# Patient Record
Sex: Female | Born: 1990 | Race: Black or African American | Hispanic: No | Marital: Single | Smoking: Never smoker
Health system: Southern US, Community
[De-identification: ages and names within clinical notes are randomized; demographics above are authoritative.]

## PROBLEM LIST (undated history)

## (undated) DIAGNOSIS — R42 Dizziness and giddiness: Secondary | ICD-10-CM

## (undated) NOTE — *Deleted (*Deleted)
Pt presents with sore throat, swollen tonsils, headache, body aches and sweats xs 3 days.

---

## 2018-12-21 ENCOUNTER — Other Ambulatory Visit: Payer: Self-pay

## 2018-12-21 DIAGNOSIS — Z20822 Contact with and (suspected) exposure to covid-19: Secondary | ICD-10-CM

## 2018-12-23 LAB — NOVEL CORONAVIRUS, NAA: SARS-CoV-2, NAA: NOT DETECTED

## 2019-03-06 ENCOUNTER — Encounter (HOSPITAL_COMMUNITY): Payer: Self-pay

## 2019-03-06 ENCOUNTER — Other Ambulatory Visit: Payer: Self-pay

## 2019-03-06 ENCOUNTER — Ambulatory Visit (HOSPITAL_COMMUNITY)
Admission: EM | Admit: 2019-03-06 | Discharge: 2019-03-06 | Disposition: A | Discharge status: Home / Self Care | Payer: Self-pay | Attending: Family Medicine | Admitting: Family Medicine

## 2019-03-06 DIAGNOSIS — L089 Local infection of the skin and subcutaneous tissue, unspecified: Secondary | ICD-10-CM

## 2019-03-06 DIAGNOSIS — R030 Elevated blood-pressure reading, without diagnosis of hypertension: Secondary | ICD-10-CM

## 2019-03-06 DIAGNOSIS — S3991XA Unspecified injury of abdomen, initial encounter: Secondary | ICD-10-CM

## 2019-03-06 MED ORDER — SULFAMETHOXAZOLE-TRIMETHOPRIM 800-160 MG PO TABS: 1.0000 | ORAL_TABLET | Freq: Two times a day (BID) | ORAL | 0 refills | Status: AC

## 2019-03-06 MED ORDER — DICLOFENAC SODIUM 75 MG PO TBEC: 75.0000 mg | DELAYED_RELEASE_TABLET | Freq: Two times a day (BID) | ORAL | 0 refills | Status: DC
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
## 2019-03-06 NOTE — Discharge Instructions (Addendum)
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
## 2019-04-08 ENCOUNTER — Ambulatory Visit (HOSPITAL_COMMUNITY)
Admission: EM | Admit: 2019-04-08 | Discharge: 2019-04-08 | Disposition: A | Discharge status: Home / Self Care | Payer: Self-pay | Attending: Family Medicine | Admitting: Family Medicine

## 2019-04-08 ENCOUNTER — Other Ambulatory Visit: Payer: Self-pay

## 2019-04-08 ENCOUNTER — Encounter (HOSPITAL_COMMUNITY): Payer: Self-pay

## 2019-04-08 DIAGNOSIS — Z20828 Contact with and (suspected) exposure to other viral communicable diseases: Secondary | ICD-10-CM | POA: Insufficient documentation

## 2019-04-08 DIAGNOSIS — R519 Headache, unspecified: Secondary | ICD-10-CM | POA: Insufficient documentation

## 2019-04-08 DIAGNOSIS — R110 Nausea: Secondary | ICD-10-CM | POA: Insufficient documentation

## 2019-04-08 DIAGNOSIS — Z791 Long term (current) use of non-steroidal anti-inflammatories (NSAID): Secondary | ICD-10-CM | POA: Insufficient documentation

## 2019-04-08 DIAGNOSIS — J029 Acute pharyngitis, unspecified: Secondary | ICD-10-CM | POA: Insufficient documentation

## 2019-04-08 DIAGNOSIS — Z803 Family history of malignant neoplasm of breast: Secondary | ICD-10-CM | POA: Insufficient documentation

## 2019-04-08 DIAGNOSIS — N644 Mastodynia: Secondary | ICD-10-CM | POA: Insufficient documentation

## 2019-04-08 LAB — POCT RAPID STREP A: Streptococcus, Group A Screen (Direct): NEGATIVE

## 2019-04-08 NOTE — ED Triage Notes (Signed)
Pt presents with non productive cough, nausea, sore throat, and headache since yesterday.

## 2019-04-08 NOTE — Discharge Instructions (Addendum)
We will send you for ultrasound and mammogram due to your breast pain. Your strep test is negative Covid swab is pending and we will call you with any positive results. You can take over-the-counter medications as needed for symptoms Warm salt water gargles Make sure you are taking precautions and quarantining until we get your Covid results. Follow up as needed for continued or worsening symptoms

## 2019-04-09 ENCOUNTER — Other Ambulatory Visit: Payer: Self-pay | Admitting: Family Medicine

## 2019-04-09 DIAGNOSIS — N644 Mastodynia: Secondary | ICD-10-CM

## 2019-04-09 NOTE — ED Provider Notes (Signed)
[place]    CSN: [id number] Arrival date & time: 04/08/19  1434      History   Chief Complaint Chief Complaint  Patient presents with  . Sore Throat  . Cough  . Headache  . Nausea    HPI Audrey Klein is a 28 y.o. female.   Patient is a 28 year old female who presents today with approximately 1 day of dry cough, nausea, sore throat, headache.  Symptoms have been constant, waxing waning.  She has not taken anything for her symptoms.  Denies any associated fever, chills, body aches or night sweats.  She is also having left breast discomfort.  This is been an ongoing problem for her for approximately 6 months or more.  Reporting the pain has worsened.  She has had previous mammogram which she is not sure of the results.  Reporting she has had the breast aspirated before with worsening symptoms.  The pain initially started underneath the left breast and now his moved into the top of the breast.  She does have a family history of breast cancer.   ROS per HPI    Sore Throat Associated symptoms include headaches.  Cough Associated symptoms: headaches   Headache Associated symptoms: cough     History reviewed. No pertinent past medical history.  There are no active problems to display for this patient.   History reviewed. No pertinent surgical history.  OB History   No obstetric history on file.      Home Medications    Prior to Admission medications   Medication Sig Start Date End Date Taking? Authorizing Provider  diclofenac (VOLTAREN) 75 MG EC tablet Take 1 tablet (75 mg total) by mouth 2 (two) times daily. 03/06/19   [name], MD    Family History Family History  Problem Relation Age of Onset  . Cancer Mother     Social History Social History   Tobacco Use  . Smoking status: Never Smoker  . Smokeless tobacco: Never Used  Substance Use Topics  . Alcohol use: Not Currently  . Drug use: Not on file     Allergies   Patient has no  known allergies.   Review of Systems Review of Systems  Respiratory: Positive for cough.   Neurological: Positive for headaches.     Physical Exam Triage Vital Signs ED Triage Vitals  Enc Vitals Group     BP 04/08/19 1551 127/88     Pulse Rate 04/08/19 1551 74     Resp 04/08/19 1551 17     Temp 04/08/19 1551 98.9 F (37.2 C)     Temp Source 04/08/19 1551 Oral     SpO2 04/08/19 1551 100 %     Weight --      Height --      Head Circumference --      Peak Flow --      Pain Score 04/08/19 1553 6     Pain Loc --      Pain Edu? --      Excl. in [place]? --    No data found.  Updated Vital Signs BP 127/88 (BP Location: Right Arm)   Pulse 74   Temp 98.9 F (37.2 C) (Oral)   Resp 17   LMP 03/26/2019   SpO2 100%   Visual Acuity Right Eye Distance:   Left Eye Distance:   Bilateral Distance:    Right Eye Near:   Left Eye Near:    Bilateral Near:  Physical Exam Vitals signs and nursing note reviewed.  Constitutional:      General: She is not in acute distress.    Appearance: Normal appearance. She is not ill-appearing, toxic-appearing or diaphoretic.  HENT:     Head: Normocephalic and atraumatic.     Right Ear: Tympanic membrane and ear canal normal.     Left Ear: Tympanic membrane and ear canal normal.     Nose: Nose normal.     Mouth/Throat:     Pharynx: Oropharynx is clear.  Eyes:     Conjunctiva/sclera: Conjunctivae normal.  Neck:     Musculoskeletal: Normal range of motion.  Pulmonary:     Effort: Pulmonary effort is normal.  Chest:     Chest wall: Tenderness present.     Breasts:        Left: Tenderness present. No swelling, bleeding, inverted nipple, mass, nipple discharge or skin change.    Musculoskeletal: Normal range of motion.  Skin:    General: Skin is warm and dry.     Findings: No bruising, erythema or rash.  Neurological:     Mental Status: She is alert.  Psychiatric:        Mood and Affect: Mood normal.      UC Treatments /  Results  Labs (all labs ordered are listed, but only abnormal results are displayed) Labs Reviewed  CULTURE, GROUP A STREP ([facility])  NOVEL CORONAVIRUS, NAA (HOSPITAL ORDER, SEND-OUT TO REF LAB)  POCT RAPID STREP A    EKG   Radiology No results found.  Procedures Procedures (including critical care time)  Medications Ordered in UC Medications - No data to display  Initial Impression / Assessment and Plan / UC Course  I have reviewed the triage vital signs and the nursing notes.  Pertinent labs & imaging results that were available during my care of the patient were reviewed by me and considered in my medical decision making (see chart for details).     Sore throat-rapid strep test negative.  Will send for culture. Covid swab pending with precautions given Recommended warm salt water gargles and over-the-counter medications as needed for symptoms. We will send her for a mammogram based on worsening left breast pain with family history of breast cancer She can take ibuprofen for pain Follow up as needed for continued or worsening symptoms  Final Clinical Impressions(s) / UC Diagnoses   Final diagnoses:  Breast pain  Sore throat     Discharge Instructions     We will send you for ultrasound and mammogram due to your breast pain. Your strep test is negative Covid swab is pending and we will call you with any positive results. You can take over-the-counter medications as needed for symptoms Warm salt water gargles Make sure you are taking precautions and quarantining until we get your Covid results. Follow up as needed for continued or worsening symptoms     ED Prescriptions    None     PDMP not reviewed this encounter.   Janace Aris, NP 04/09/19 1144

## 2019-04-10 LAB — CULTURE, GROUP A STREP (THRC)

## 2019-04-10 LAB — NOVEL CORONAVIRUS, NAA (HOSP ORDER, SEND-OUT TO REF LAB; TAT 18-24 HRS): SARS-CoV-2, NAA: NOT DETECTED

## 2019-04-26 ENCOUNTER — Other Ambulatory Visit: Payer: Self-pay

## 2019-04-26 ENCOUNTER — Ambulatory Visit (HOSPITAL_COMMUNITY)
Admission: EM | Admit: 2019-04-26 | Discharge: 2019-04-26 | Disposition: A | Discharge status: Home / Self Care | Payer: Self-pay | Attending: Family Medicine | Admitting: Family Medicine

## 2019-04-26 ENCOUNTER — Encounter (HOSPITAL_COMMUNITY): Payer: Self-pay

## 2019-04-26 DIAGNOSIS — U071 COVID-19: Secondary | ICD-10-CM

## 2019-04-26 LAB — POC SARS CORONAVIRUS 2 AG: SARS Coronavirus 2 Ag: POSITIVE — AB

## 2019-04-26 LAB — POC SARS CORONAVIRUS 2 AG -  ED: SARS Coronavirus 2 Ag: POSITIVE — AB

## 2019-04-26 MED ORDER — ONDANSETRON HCL 4 MG PO TABS: 4.0000 mg | ORAL_TABLET | Freq: Four times a day (QID) | ORAL | 0 refills | Status: DC

## 2019-04-26 NOTE — Discharge Instructions (Signed)
Rest Push fluids Take the zofran as directed- pick up at drive through Tylenol for pain or fever CALL for problems or questions Go to ER for weakness or shortness of breath that worsens

## 2019-04-26 NOTE — ED Triage Notes (Signed)
Pt presents to UC w/ c/o loss of taste, loss of smell, coughing, dizziness, weakness, nausea x4 days. Pt states she has not eaten solid food in 4 days but has been able to keep water down.

## 2019-04-26 NOTE — ED Provider Notes (Signed)
[facility]    CSN: [id number] Arrival date & time: 04/26/19  1114      History   Chief Complaint Chief Complaint  Patient presents with  . loss of taste, smell, fatigue    HPI Audrey Klein is a 28 y.o. female.   HPI  Patient has been sick for 4 days.  She feels weak, dizzy, she has been coughing.  No appetite.  She feels nausea and has not been eating.  She has kept down some water and fluids.  She does not feel like she has normal taste or smell.  Has not measured her temperature at home, she has had some chills. No known exposure to coronavirus She works at [employer]  History reviewed. No pertinent past medical history.  There are no active problems to display for this patient.   History reviewed. No pertinent surgical history.  OB History   No obstetric history on file.      Home Medications    Prior to Admission medications   Medication Sig Start Date End Date Taking? Authorizing Provider  ondansetron (ZOFRAN) 4 MG tablet Take 1-2 tablets (4-8 mg total) by mouth every 6 (six) hours. 04/26/19   Eustace Moore, MD    Family History Family History  Problem Relation Age of Onset  . Cancer Mother   . Healthy Father     Social History Social History   Tobacco Use  . Smoking status: Never Smoker  . Smokeless tobacco: Never Used  Substance Use Topics  . Alcohol use: Not Currently  . Drug use: Not on file     Allergies   Patient has no known allergies.   Review of Systems Review of Systems  Constitutional: Positive for appetite change, chills and fatigue. Negative for fever.  HENT: Negative for ear pain and sore throat.   Eyes: Negative for pain and visual disturbance.  Respiratory: Positive for cough. Negative for shortness of breath.   Cardiovascular: Negative for chest pain and palpitations.  Gastrointestinal: Positive for nausea. Negative for abdominal pain and vomiting.  Genitourinary: Negative for dysuria and hematuria.   Musculoskeletal: Negative for arthralgias and back pain.  Skin: Negative for color change and rash.  Neurological: Negative for seizures and syncope.  All other systems reviewed and are negative.    Physical Exam Triage Vital Signs ED Triage Vitals  Enc Vitals Group     BP 04/26/19 1152 110/62     Pulse Rate 04/26/19 1152 74     Resp 04/26/19 1152 16     Temp 04/26/19 1152 98.7 F (37.1 C)     Temp Source 04/26/19 1152 Oral     SpO2 04/26/19 1152 100 %     Weight --      Height --      Head Circumference --      Peak Flow --      Pain Score 04/26/19 1157 0     Pain Loc --      Pain Edu? --      Excl. in [place]? --    No data found.  Updated Vital Signs BP 110/62 (BP Location: Right Arm)   Pulse 74   Temp 98.7 F (37.1 C) (Oral)   Resp 16   LMP 04/18/2019   SpO2 100%      Physical Exam Constitutional:      General: She is not in acute distress.    Appearance: She is well-developed. She is obese. She is ill-appearing.  Comments: Appears tired.  Tearful  HENT:     Head: Normocephalic and atraumatic.     Mouth/Throat:     Comments: Mask in place Eyes:     Conjunctiva/sclera: Conjunctivae normal.     Pupils: Pupils are equal, round, and reactive to light.  Neck:     Musculoskeletal: Normal range of motion.  Cardiovascular:     Rate and Rhythm: Normal rate and regular rhythm.     Heart sounds: Normal heart sounds.  Pulmonary:     Effort: Pulmonary effort is normal. No respiratory distress.     Comments: Lungs are clear Abdominal:     General: There is no distension.     Palpations: Abdomen is soft.  Musculoskeletal: Normal range of motion.  Skin:    General: Skin is warm and dry.  Neurological:     Mental Status: She is alert.  Psychiatric:        Mood and Affect: Mood normal.        Behavior: Behavior normal.      UC Treatments / Results  Labs (all labs ordered are listed, but only abnormal results are displayed) Labs Reviewed  POC SARS  CORONAVIRUS 2 AG -  ED - Abnormal; Notable for the following components:      Result Value   SARS Coronavirus 2 Ag POSITIVE (*)    All other components within normal limits  POC SARS CORONAVIRUS 2 AG - Abnormal; Notable for the following components:   SARS Coronavirus 2 Ag POSITIVE (*)    All other components within normal limits    EKG   Radiology No results found.  Procedures Procedures (including critical care time)  Medications Ordered in UC Medications - No data to display  Initial Impression / Assessment and Plan / UC Course  I have reviewed the triage vital signs and the nursing notes.  Pertinent labs & imaging results that were available during my care of the patient were reviewed by me and considered in my medical decision making (see chart for details).     We reviewed coronavirus symptoms.  Treatment.  Expectations. Return to work Final Clinical Impressions(s) / UC Diagnoses   Final diagnoses:  COVID-19 virus infection     Discharge Instructions     Rest Push fluids Take the zofran as directed- pick up at drive through Tylenol for pain or fever CALL for problems or questions Go to ER for weakness or shortness of breath that worsens   ED Prescriptions    Medication Sig Dispense Auth. Provider   ondansetron (ZOFRAN) 4 MG tablet Take 1-2 tablets (4-8 mg total) by mouth every 6 (six) hours. 12 tablet Raylene Everts, MD     PDMP not reviewed this encounter.   Raylene Everts, MD 04/26/19 1320

## 2019-08-07 ENCOUNTER — Encounter (HOSPITAL_COMMUNITY): Payer: Self-pay | Admitting: Emergency Medicine

## 2019-08-07 ENCOUNTER — Ambulatory Visit (HOSPITAL_COMMUNITY)
Admission: EM | Admit: 2019-08-07 | Discharge: 2019-08-07 | Disposition: A | Discharge status: Home / Self Care | Payer: Self-pay | Attending: Emergency Medicine | Admitting: Emergency Medicine

## 2019-08-07 ENCOUNTER — Other Ambulatory Visit: Payer: Self-pay

## 2019-08-07 DIAGNOSIS — S0501XA Injury of conjunctiva and corneal abrasion without foreign body, right eye, initial encounter: Secondary | ICD-10-CM

## 2019-08-07 DIAGNOSIS — H18821 Corneal disorder due to contact lens, right eye: Secondary | ICD-10-CM

## 2019-08-07 MED ORDER — ERYTHROMYCIN 5 MG/GM OP OINT: TOPICAL_OINTMENT | OPHTHALMIC | 1 refills | Status: DC

## 2019-08-07 NOTE — Discharge Instructions (Addendum)
Do not wear contacts for 1 week  If you have increase of pressure or blurred vision you will need to go to the ER  May use saline drops to help

## 2019-08-07 NOTE — ED Triage Notes (Signed)
Seen by provider prior to this nurse 

## 2019-08-07 NOTE — ED Provider Notes (Signed)
[facility]    CSN: [id number] Arrival date & time: 08/07/19  1955      History   Chief Complaint Chief Complaint  Patient presents with  . Eye Problem    HPI Audrey Klein is a 29 y.o. female.   Pt states that she was wearing contact yesterday and rubbed rt eye abd began to nice itching, pain, and burning to eye. Began using saline eye drops to help sooth. States she can see out of her eye it just burnings to open all the way.      History reviewed. No pertinent past medical history.  There are no problems to display for this patient.   History reviewed. No pertinent surgical history.  OB History   No obstetric history on file.      Home Medications    Prior to Admission medications   Medication Sig Start Date End Date Taking? Authorizing Provider  erythromycin ophthalmic ointment Place a 1/2 inch ribbon of ointment into the lower eyelid. 08/07/19   Coralyn Mark, NP  ondansetron (ZOFRAN) 4 MG tablet Take 1-2 tablets (4-8 mg total) by mouth every 6 (six) hours. 04/26/19   Eustace Moore, MD    Family History Family History  Problem Relation Age of Onset  . Cancer Mother   . Healthy Father     Social History Social History   Tobacco Use  . Smoking status: Never Smoker  . Smokeless tobacco: Never Used  Substance Use Topics  . Alcohol use: Not Currently  . Drug use: Never     Allergies   Patient has no known allergies.   Review of Systems Review of Systems  Constitutional: Negative.   HENT: Negative.   Eyes: Positive for pain, redness and itching.  Neurological: Negative.      Physical Exam Triage Vital Signs ED Triage Vitals [08/07/19 2039]  Enc Vitals Group     BP      Pulse      Resp      Temp      Temp src      SpO2      Weight      Height      Head Circumference      Peak Flow      Pain Score 10     Pain Loc      Pain Edu?      Excl. in [place]?    No data found.  Updated Vital Signs BP 118/74 (BP  Location: Right Arm) Comment (BP Location): large cuff  Pulse 67   Temp 98.6 F (37 C) (Oral)   Resp 20   LMP 07/16/2019   SpO2 100%   Visual Acuity Right Eye Distance:   Left Eye Distance:20/30   Bilateral Distance:    Right Eye Near:   Left Eye Near:    Bilateral Near:    Pt states that it is lurry but she is not able to open rt eye well to complete visual acuity  Physical Exam HENT:     Head: Normocephalic.     Nose: Nose normal.     Mouth/Throat:     Mouth: Mucous membranes are moist.  Eyes:     Comments: RT eye erythema noted to conjuctivae, slight abrasion noted to 6 oclock area on cornea,   Musculoskeletal:     Cervical back: Normal range of motion.  Neurological:     General: No focal deficit present.     Mental Status: She  is alert.      UC Treatments / Results  Labs (all labs ordered are listed, but only abnormal results are displayed) Labs Reviewed - No data to display  EKG   Radiology No results found.  Procedures Procedures (including critical care time)  Medications Ordered in UC Medications - No data to display  Initial Impression / Assessment and Plan / UC Course  I have reviewed the triage vital signs and the nursing notes.  Pertinent labs & imaging results that were available during my care of the patient were reviewed by me and considered in my medical decision making (see chart for details).     Do not wear contacts for 1 week  If you have increase of pressure or blurred vision you will need to go to the ER  May use saline drops to help  Final Clinical Impressions(s) / UC Diagnoses   Final diagnoses:  Corneal abrasion of right eye due to contact lens  Abrasion of right cornea, initial encounter     Discharge Instructions     Do not wear contacts for 1 week  If you have increase of pressure or blurred vision you will need to go to the ER  May use saline drops to help      ED Prescriptions    Medication Sig Dispense  Auth. Provider   erythromycin ophthalmic ointment Place a 1/2 inch ribbon of ointment into the lower eyelid. 1 g Marney Setting, NP     PDMP not reviewed this encounter.   Marney Setting, NP 08/07/19 2050

## 2020-01-06 ENCOUNTER — Emergency Department (HOSPITAL_COMMUNITY)
Admission: EM | Admit: 2020-01-06 | Discharge: 2020-01-07 | Disposition: A | Discharge status: Home / Self Care | Payer: BC Managed Care – PPO | Attending: Emergency Medicine | Admitting: Emergency Medicine

## 2020-01-06 ENCOUNTER — Emergency Department (HOSPITAL_COMMUNITY): Payer: BC Managed Care – PPO

## 2020-01-06 ENCOUNTER — Encounter (HOSPITAL_COMMUNITY): Payer: Self-pay

## 2020-01-06 ENCOUNTER — Other Ambulatory Visit: Payer: Self-pay

## 2020-01-06 DIAGNOSIS — R0602 Shortness of breath: Secondary | ICD-10-CM | POA: Insufficient documentation

## 2020-01-06 DIAGNOSIS — R519 Headache, unspecified: Secondary | ICD-10-CM | POA: Insufficient documentation

## 2020-01-06 DIAGNOSIS — R079 Chest pain, unspecified: Secondary | ICD-10-CM | POA: Diagnosis present

## 2020-01-06 LAB — BASIC METABOLIC PANEL
Anion gap: 10 (ref 5–15)
BUN: 13 mg/dL (ref 6–20)
CO2: 25 mmol/L (ref 22–32)
Calcium: 8.7 mg/dL — ABNORMAL LOW (ref 8.9–10.3)
Chloride: 105 mmol/L (ref 98–111)
Creatinine, Ser: 1.01 mg/dL — ABNORMAL HIGH (ref 0.44–1.00)
GFR calc Af Amer: >60 mL/min (ref >60)
GFR calc non Af Amer: >60 mL/min (ref >60)
Glucose, Bld: 84 mg/dL (ref 70–99)
Potassium: 3.6 mmol/L (ref 3.5–5.1)
Sodium: 140 mmol/L (ref 135–145)

## 2020-01-06 LAB — CBC
HCT: 40.9 % (ref 36.0–46.0)
Hemoglobin: 12.9 g/dL (ref 12.0–15.0)
MCH: 29.5 pg (ref 26.0–34.0)
MCHC: 31.5 g/dL (ref 30.0–36.0)
MCV: 93.6 fL (ref 80.0–100.0)
Platelets: 380 10*3/uL (ref 150–400)
RBC: 4.37 MIL/uL (ref 3.87–5.11)
RDW: 12.8 % (ref 11.5–15.5)
WBC: 10 10*3/uL (ref 4.0–10.5)
nRBC: 0 % (ref 0.0–0.2)

## 2020-01-06 LAB — I-STAT BETA HCG BLOOD, ED (MC, WL, AP ONLY): I-stat hCG, quantitative: <5 m[IU]/mL (ref <5)

## 2020-01-06 LAB — TROPONIN I (HIGH SENSITIVITY): Troponin I (High Sensitivity): <2 ng/L (ref <18)

## 2020-01-06 MED ORDER — SODIUM CHLORIDE 0.9% FLUSH: 3.0000 mL | Freq: Once | INTRAVENOUS | Status: DC

## 2020-01-06 NOTE — ED Triage Notes (Signed)
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

## 2020-01-07 ENCOUNTER — Other Ambulatory Visit: Payer: Self-pay

## 2020-01-07 ENCOUNTER — Encounter (HOSPITAL_COMMUNITY): Payer: Self-pay | Admitting: Emergency Medicine

## 2020-01-07 ENCOUNTER — Emergency Department (HOSPITAL_COMMUNITY): Payer: BC Managed Care – PPO

## 2020-01-07 DIAGNOSIS — M25512 Pain in left shoulder: Secondary | ICD-10-CM | POA: Insufficient documentation

## 2020-01-07 DIAGNOSIS — R072 Precordial pain: Secondary | ICD-10-CM | POA: Insufficient documentation

## 2020-01-07 DIAGNOSIS — R519 Headache, unspecified: Secondary | ICD-10-CM | POA: Insufficient documentation

## 2020-01-07 LAB — BASIC METABOLIC PANEL
Anion gap: 8 (ref 5–15)
BUN: 14 mg/dL (ref 6–20)
CO2: 27 mmol/L (ref 22–32)
Calcium: 8.5 mg/dL — ABNORMAL LOW (ref 8.9–10.3)
Chloride: 106 mmol/L (ref 98–111)
Creatinine, Ser: 0.78 mg/dL (ref 0.44–1.00)
GFR calc Af Amer: >60 mL/min (ref >60)
GFR calc non Af Amer: >60 mL/min (ref >60)
Glucose, Bld: 89 mg/dL (ref 70–99)
Potassium: 3.8 mmol/L (ref 3.5–5.1)
Sodium: 141 mmol/L (ref 135–145)

## 2020-01-07 LAB — CBC
HCT: 39.9 % (ref 36.0–46.0)
Hemoglobin: 12.7 g/dL (ref 12.0–15.0)
MCH: 29.9 pg (ref 26.0–34.0)
MCHC: 31.8 g/dL (ref 30.0–36.0)
MCV: 93.9 fL (ref 80.0–100.0)
Platelets: 373 10*3/uL (ref 150–400)
RBC: 4.25 MIL/uL (ref 3.87–5.11)
RDW: 12.7 % (ref 11.5–15.5)
WBC: 7.9 10*3/uL (ref 4.0–10.5)
nRBC: 0 % (ref 0.0–0.2)

## 2020-01-07 LAB — TROPONIN I (HIGH SENSITIVITY)
Troponin I (High Sensitivity): 3 ng/L (ref <18)
Troponin I (High Sensitivity): <2 ng/L (ref <18)

## 2020-01-07 LAB — I-STAT BETA HCG BLOOD, ED (MC, WL, AP ONLY): I-stat hCG, quantitative: <5 m[IU]/mL (ref <5)

## 2020-01-07 MED ORDER — ACETAMINOPHEN 325 MG PO TABS
650.0000 mg | ORAL_TABLET | Freq: Once | ORAL | Status: AC
  Administered 2020-01-07: 650 mg via ORAL
  Filled 2020-01-07: qty 2

## 2020-01-07 NOTE — ED Notes (Signed)
Called for vitals x3, no answer. 

## 2020-01-07 NOTE — ED Triage Notes (Signed)
Complains of chest pain x5 days, has a HA. Also hx of vertigo. States her chest pain is worsening.

## 2020-01-08 ENCOUNTER — Emergency Department (HOSPITAL_COMMUNITY)
Admission: EM | Admit: 2020-01-08 | Discharge: 2020-01-08 | Disposition: A | Discharge status: Home / Self Care | Payer: BC Managed Care – PPO | Source: Home / Self Care | Attending: Emergency Medicine | Admitting: Emergency Medicine

## 2020-01-08 DIAGNOSIS — R072 Precordial pain: Secondary | ICD-10-CM

## 2020-01-08 MED ORDER — NAPROXEN 500 MG PO TABS: 500.0000 mg | ORAL_TABLET | Freq: Two times a day (BID) | ORAL | 0 refills | Status: DC

## 2020-01-08 MED ORDER — KETOROLAC TROMETHAMINE 30 MG/ML IJ SOLN
30.0000 mg | Freq: Once | INTRAMUSCULAR | Status: AC
  Administered 2020-01-08: 30 mg via INTRAMUSCULAR
  Filled 2020-01-08: qty 1

## 2020-01-08 NOTE — ED Provider Notes (Signed)
[facility] Provider Note   CSN: [id number] Arrival date & time: 01/07/20  1334    History Chest Pain  Audrey Klein is a 29 y.o. female with no significant past medical history who presents for evaluation of chest pain.  Patient with left upper chest pain over the last week. No recent injury or trauma. Worse when she moves her left arm.  As well as palpating her left upper chest. She has no breast pain. No associated shortness of breath, cough, hemoptysis, fever, chills, nausea, vomiting, lightheadedness, dizziness, neck pain, neck stiffness, congestion, rhinorrhea, abdominal pain, unilateral leg swelling, redness or warmth.  No exogenous hormone use, recent surgery, mobilization, ligament C.  No prior history of PE, DVT or clotting disorder.  Rates her pain a 5/10, worse with movement.  Denies additional aggravating or relieving factors. Mild headache. No sudden onset thunderclap headache.  No vision changes, facial droop, unilateral weakness, paresthesias, neck pain.  History obtained from patient and past medical records.  No interpreter used.  HPI     History reviewed. No pertinent past medical history.  There are no problems to display for this patient.   History reviewed. No pertinent surgical history.   OB History   No obstetric history on file.     Family History  Problem Relation Age of Onset  . Cancer Mother   . Healthy Father     Social History   Tobacco Use  . Smoking status: Never Smoker  . Smokeless tobacco: Never Used  Vaping Use  . Vaping Use: Never used  Substance Use Topics  . Alcohol use: Yes  . Drug use: Never    Home Medications Prior to Admission medications   Medication Sig Start Date End Date Taking? Authorizing Provider  erythromycin ophthalmic ointment Place a 1/2 inch ribbon of ointment into the lower eyelid. 08/07/19   Marney Setting, NP  naproxen (NAPROSYN) 500 MG tablet Take 1 tablet (500 mg  total) by mouth 2 (two) times daily. 01/08/20   Beyonce Sawatzky A, PA-C  ondansetron (ZOFRAN) 4 MG tablet Take 1-2 tablets (4-8 mg total) by mouth every 6 (six) hours. 04/26/19   Raylene Everts, MD    Allergies    Patient has no known allergies.  Review of Systems   Review of Systems  Constitutional: Negative.   HENT: Negative.   Respiratory: Negative.   Cardiovascular: Positive for chest pain. Negative for palpitations and leg swelling.  Gastrointestinal: Negative.   Genitourinary: Negative.   Musculoskeletal:       Left shoulder pain with movement  Skin: Negative.   Neurological: Positive for headaches. Negative for dizziness, tremors, seizures, syncope, facial asymmetry, speech difficulty, weakness, light-headedness and numbness.  All other systems reviewed and are negative.   Physical Exam Updated Vital Signs BP (!) 148/98 (BP Location: Left Arm)   Pulse 74   Temp 98.8 F (37.1 C)   Resp 18   Ht _0  (1.727 m)   Wt 120 kg   LMP 01/02/2020   SpO2 100%   BMI 40.22 kg/m   Physical Exam Vitals and nursing note reviewed.  Constitutional:      General: She is not in acute distress.    Appearance: She is not ill-appearing, toxic-appearing or diaphoretic.  HENT:     Head: Normocephalic and atraumatic.     Jaw: There is normal jaw occlusion.  Eyes:     Comments: No horizontal, vertical or rotational nystagmus   Neck:  Vascular: No carotid bruit or JVD.     Trachea: Trachea and phonation normal.     Comments: Full active and passive ROM without pain No midline or paraspinal tenderness No nuchal rigidity or meningeal signs  Cardiovascular:     Rate and Rhythm: Normal rate.     Pulses: Normal pulses.          Radial pulses are 2+ on the right side and 2+ on the left side.       Posterior tibial pulses are 2+ on the right side and 2+ on the left side.     Heart sounds: Normal heart sounds.  Pulmonary:     Effort: Pulmonary effort is normal.     Breath  sounds: Normal breath sounds and air entry.  Chest:       Comments: Tenderness palpation to left anterior shoulder and left upper chest wall.  No crepitus, step-offs.  No edema, erythema or warmth. No gross infectious process Musculoskeletal:     Cervical back: Full passive range of motion without pain, normal range of motion and neck supple.  Lymphadenopathy:     Cervical: No cervical adenopathy.  Neurological:     Mental Status: She is alert.     Comments: Mental Status:  Alert, oriented, thought content appropriate. Speech fluent without evidence of aphasia. Able to follow 2 step commands without difficulty.  Cranial Nerves:  II:  Peripheral visual fields grossly normal, pupils equal, round, reactive to light III,IV, VI: ptosis not present, extra-ocular motions intact bilaterally  V,VII: smile symmetric, facial light touch sensation equal VIII: hearing grossly normal bilaterally  IX,X: midline uvula rise  XI: bilateral shoulder shrug equal and strong XII: midline tongue extension  Motor:  5/5 in upper and lower extremities bilaterally including strong and equal grip strength and dorsiflexion/plantar flexion Sensory: Pinprick and light touch normal in all extremities.  Deep Tendon Reflexes: 2+ and symmetric  Cerebellar: normal finger-to-nose with bilateral upper extremities Gait: normal gait and balance CV: distal pulses palpable throughout      ED Results / Procedures / Treatments   Labs (all labs ordered are listed, but only abnormal results are displayed) Labs Reviewed  BASIC METABOLIC PANEL - Abnormal; Notable for the following components:      Result Value   Calcium 8.5 (*)    All other components within normal limits  CBC  I-STAT BETA HCG BLOOD, ED ([facility], [facility], [facility] ONLY)  TROPONIN I (HIGH SENSITIVITY)    EKG None  Radiology DG Chest 2 View  Result Date: 01/07/2020 CLINICAL DATA:  Chest pain and headache for 5 days. EXAM: CHEST - 2 VIEW COMPARISON:  PA and lateral  chest 01/06/2020. FINDINGS: Lungs clear. Heart size normal. No pneumothorax or pleural fluid. No acute or focal bony abnormality. IMPRESSION: Normal chest. Electronically Signed   By: Inge Rise M.D.   On: 01/07/2020 16:34   DG Chest 2 View  Result Date: 01/06/2020 CLINICAL DATA:  Initial evaluation for acute chest pain. EXAM: CHEST - 2 VIEW COMPARISON:  None. FINDINGS: The cardiac and mediastinal silhouettes are within normal limits. The lungs are normally inflated. No airspace consolidation, pleural effusion, or pulmonary edema. No pneumothorax. No acute osseous abnormality. IMPRESSION: No active cardiopulmonary disease. Electronically Signed   By: Jeannine Boga M.D.   On: 01/06/2020 22:14    Procedures Procedures (including critical care time)  Medications Ordered in ED Medications  ketorolac (TORADOL) 30 MG/ML injection 30 mg (30 mg Intramuscular Given 01/08/20 0402)  ED Course  I have reviewed the triage vital signs and the nursing notes.  Pertinent labs & imaging results that were available during my care of the patient were reviewed by me and considered in my medical decision making (see chart for details).  29 year old presents for evaluation of chest pain x1 week.  Worse with movement to left upper extremity.  She is afebrile, nonseptic, not ill-appearing.  No exertional or pleuritic chest pain.  No unilateral leg swelling, redness or warmth.  No prior history of PE or DVT.  She is PERC negative, Wells criteria low risk.  Clinically no evidence of DVT on exam.  She has no exertional chest pain.  No associated shortness of breath, diaphoresis, nausea or vomiting.  Compartments are soft.  My unable to reproduce her pain to anterior aspect of left shoulder left upper chest wall.  No neck stiffness or neck rigidity.  Patient also with headache.  No sudden onset thunderclap headache.  She has a nonfocal neuro exam without deficits.  Ambulatory that ataxic gait.  Patient is to  be discharged with recommendation to follow up with PCP in regards to today's hospital visit. Chest pain is not likely of cardiac or pulmonary etiology d/t presentation, PERC negative, VSS, no tracheal deviation, no JVD or new murmur, RRR, breath sounds equal bilaterally, EKG without acute abnormalities, negative troponin, and negative CXR. Pt has been advised to return to the ED if CP becomes exertional, associated with diaphoresis or nausea, radiates to left jaw/arm, worsens or becomes concerning in any way. Pt appears reliable for follow up and is agreeable to discharge.   Labs and imaging personally reviewed and interpreted: BMP without significant findings CBC without leukocytosis Pregnancy test negative Troponin less than 2, had 2 - delta troponins yesterday at [facility]. EKG without STEMI Chest x-ray without infiltrates, cardiomegaly, pulmonary MAC, pneumothorax  Presentation non concerning for dissection, CVA, SAH, ICH, Meningitis, or temporal arteritis. Pt is afebrile with no focal neuro deficits, nuchal rigidity, or change in vision.   The patient has been appropriately medically screened and/or stabilized in the ED. I have low suspicion for any other emergent medical condition which would require further screening, evaluation or treatment in the ED or require inpatient management.  Patient is hemodynamically stable and in no acute distress.  Patient able to ambulate in department prior to ED.  Evaluation does not show acute pathology that would require ongoing or additional emergent interventions while in the emergency department or further inpatient treatment.  I have discussed the diagnosis with the patient and answered all questions.  Pain is been managed while in the emergency department and patient has no further complaints prior to discharge.  Patient is comfortable with plan discussed in room and is stable for discharge at this time.  I have discussed strict return precautions for  returning to the emergency department.  Patient was encouraged to follow-up with PCP/specialist refer to at discharge.   MDM Rules/Calculators/A&P                          Chalon Zobrist was evaluated in Emergency Department on 01/08/2020 for the symptoms described in the history of present illness. She was evaluated in the context of the global COVID-19 pandemic, which necessitated consideration that the patient might be at risk for infection with the SARS-CoV-2 virus that causes COVID-19. Institutional protocols and algorithms that pertain to the evaluation of patients at risk for COVID-19 are in a  state of rapid change based on information released by regulatory bodies including the CDC and federal and state organizations. These policies and algorithms were followed during the patient's care in the ED. Final Clinical Impression(s) / ED Diagnoses Final diagnoses:  Precordial pain    Rx / DC Orders ED Discharge Orders         Ordered    naproxen (NAPROSYN) 500 MG tablet  2 times daily     Discontinue  Reprint     01/08/20 0424           Alleen Kehm A, PA-C 01/08/20 0433    Molpus, Jenny Reichmann, MD 01/08/20 0502

## 2020-01-08 NOTE — Discharge Instructions (Signed)
Follow up with Cardiology if your symptoms do not resolve  Return for new or worsening symptoms

## 2020-02-10 ENCOUNTER — Encounter (HOSPITAL_COMMUNITY): Payer: Self-pay

## 2020-02-10 ENCOUNTER — Other Ambulatory Visit: Payer: Self-pay

## 2020-02-10 ENCOUNTER — Ambulatory Visit (HOSPITAL_COMMUNITY)
Admission: EM | Admit: 2020-02-10 | Discharge: 2020-02-10 | Disposition: A | Discharge status: Home / Self Care | Payer: BC Managed Care – PPO | Attending: Emergency Medicine | Admitting: Emergency Medicine

## 2020-02-10 DIAGNOSIS — Z791 Long term (current) use of non-steroidal anti-inflammatories (NSAID): Secondary | ICD-10-CM | POA: Diagnosis not present

## 2020-02-10 DIAGNOSIS — R112 Nausea with vomiting, unspecified: Secondary | ICD-10-CM | POA: Insufficient documentation

## 2020-02-10 DIAGNOSIS — J069 Acute upper respiratory infection, unspecified: Secondary | ICD-10-CM | POA: Insufficient documentation

## 2020-02-10 DIAGNOSIS — R0602 Shortness of breath: Secondary | ICD-10-CM | POA: Diagnosis not present

## 2020-02-10 DIAGNOSIS — Z20822 Contact with and (suspected) exposure to covid-19: Secondary | ICD-10-CM | POA: Diagnosis not present

## 2020-02-10 MED ORDER — ONDANSETRON 4 MG PO TBDP
ORAL_TABLET | ORAL | Status: AC
  Filled 2020-02-10: qty 1

## 2020-02-10 MED ORDER — ONDANSETRON 4 MG PO TBDP
4.0000 mg | ORAL_TABLET | Freq: Once | ORAL | Status: AC
  Administered 2020-02-10: 4 mg via ORAL

## 2020-02-10 MED ORDER — ONDANSETRON HCL 4 MG PO TABS: 4.0000 mg | ORAL_TABLET | Freq: Three times a day (TID) | ORAL | 0 refills | Status: DC | PRN

## 2020-02-10 NOTE — Discharge Instructions (Signed)
Small frequent sips of fluids- Pedialyte, Gatorade, water, broth- to maintain hydration.   Tylenol and/or ibuprofen as needed for pain or fevers.   Self isolate until covid results are back and negative.  Will notify you by phone of any positive findings. Your negative results will be sent through your MyChart.

## 2020-02-10 NOTE — ED Triage Notes (Signed)
Pt c/o non-productive cough, mild SOB, HA, sore throat, fatigue/malaise onset Friday. Reports nausea, diarrhea onset yesterday. Denies congestion, runny nose, ear pain.  Lungs CTA bilaterally.

## 2020-02-10 NOTE — ED Provider Notes (Signed)
[facility]    CSN: [id number] Arrival date & time: 02/10/20  1729      History   Chief Complaint Chief Complaint  Patient presents with   Cough    HPI Audrey Klein is a 29 y.o. female.   Audrey Klein presents with complaints of nausea, some shortness of breath, some chest pain, migraine, slight cough. Symptoms started 9/10. Nausea has worsened. Some vomiting today. No diarrhea. No fevers. Has been around her friend and her friend's daughter who have been ill- vomiting and diarrhea. 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      ROS per HPI, negative if not otherwise mentioned.      History reviewed. No pertinent past medical history.  There are no problems to display for this patient.   History reviewed. No pertinent surgical history.  OB History   No obstetric history on file.      Home Medications    Prior to Admission medications   Medication Sig Start Date End Date Taking? Authorizing Provider  erythromycin ophthalmic ointment Place a 1/2 inch ribbon of ointment into the lower eyelid. 08/07/19   Coralyn Mark, NP  naproxen (NAPROSYN) 500 MG tablet Take 1 tablet (500 mg total) by mouth 2 (two) times daily. 01/08/20   Henderly, Britni A, PA-C  ondansetron (ZOFRAN) 4 MG tablet Take 1 tablet (4 mg total) by mouth every 8 (eight) hours as needed for nausea or vomiting. 02/10/20   Georgetta Haber, NP    Family History Family History  Problem Relation Age of Onset   Cancer Mother    Healthy Father     Social History Social History   Tobacco Use   Smoking status: Never Smoker   Smokeless tobacco: Never Used  Building services engineer Use: Never used  Substance Use Topics   Alcohol use: Yes   Drug use: Never     Allergies   Patient has no known allergies.   Review of Systems Review of Systems   Physical Exam Triage Vital Signs ED Triage Vitals  Enc Vitals Group     BP 02/10/20 1954 (!) 120/56     Pulse Rate 02/10/20 1954 79     Resp  02/10/20 1954 20     Temp 02/10/20 1954 99.3 F (37.4 C)     Temp Source 02/10/20 1954 Oral     SpO2 02/10/20 1954 100 %     Weight --      Height --      Head Circumference --      Peak Flow --      Pain Score 02/10/20 1952 10     Pain Loc --      Pain Edu? --      Excl. in GC? --    No data found.  Updated Vital Signs BP (!) 120/56 (BP Location: Right Wrist)    Pulse 79    Temp 99.3 F (37.4 C) (Oral)    Resp 20    LMP 02/03/2020    SpO2 100%   Visual Acuity Right Eye Distance:   Left Eye Distance:   Bilateral Distance:    Right Eye Near:   Left Eye Near:    Bilateral Near:     Physical Exam Constitutional:      General: She is not in acute distress.    Appearance: She is well-developed.  Cardiovascular:     Rate and Rhythm: Normal rate.  Pulmonary:     Effort:  Pulmonary effort is normal.  Skin:    General: Skin is warm and dry.  Neurological:     Mental Status: She is alert and oriented to person, place, and time.      UC Treatments / Results  Labs (all labs ordered are listed, but only abnormal results are displayed) Labs Reviewed  SARS CORONAVIRUS 2 (TAT 6-24 HRS)    EKG   Radiology No results found.  Procedures Procedures (including critical care time)  Medications Ordered in UC Medications  ondansetron (ZOFRAN-ODT) disintegrating tablet 4 mg (4 mg Oral Given 02/10/20 2001)    Initial Impression / Assessment and Plan / UC Course  I have reviewed the triage vital signs and the nursing notes.  Pertinent labs & imaging results that were available during my care of the patient were reviewed by me and considered in my medical decision making (see chart for details).     Non toxic. Benign physical exam.  Illness after being around others with similar illness in her home. History and physical consistent with viral illness.  Covid testing pending and isolation instructions provided.  Supportive cares recommended. zofran as needed. Return  precautions provided. Patient verbalized understanding and agreeable to plan.    Final Clinical Impressions(s) / UC Diagnoses   Final diagnoses:  Upper respiratory tract infection, unspecified type  Non-intractable vomiting with nausea, unspecified vomiting type     Discharge Instructions     Small frequent sips of fluids- Pedialyte, Gatorade, water, broth- to maintain hydration.   Tylenol and/or ibuprofen as needed for pain or fevers.   Self isolate until covid results are back and negative.  Will notify you by phone of any positive findings. Your negative results will be sent through your MyChart.         ED Prescriptions    Medication Sig Dispense Auth. Provider   ondansetron (ZOFRAN) 4 MG tablet Take 1 tablet (4 mg total) by mouth every 8 (eight) hours as needed for nausea or vomiting. 10 tablet Georgetta Haber, NP     PDMP not reviewed this encounter.   Georgetta Haber, NP 02/11/20 2204

## 2020-02-11 LAB — SARS CORONAVIRUS 2 (TAT 6-24 HRS): SARS Coronavirus 2: NEGATIVE

## 2020-04-29 ENCOUNTER — Encounter (HOSPITAL_COMMUNITY): Payer: Self-pay | Admitting: Emergency Medicine

## 2020-04-29 ENCOUNTER — Other Ambulatory Visit: Payer: Self-pay

## 2020-04-29 ENCOUNTER — Ambulatory Visit (HOSPITAL_COMMUNITY)
Admission: EM | Admit: 2020-04-29 | Discharge: 2020-04-29 | Disposition: A | Discharge status: Home / Self Care | Payer: BC Managed Care – PPO | Attending: Emergency Medicine | Admitting: Emergency Medicine

## 2020-04-29 DIAGNOSIS — R5381 Other malaise: Secondary | ICD-10-CM | POA: Diagnosis not present

## 2020-04-29 DIAGNOSIS — R519 Headache, unspecified: Secondary | ICD-10-CM | POA: Diagnosis not present

## 2020-04-29 DIAGNOSIS — J029 Acute pharyngitis, unspecified: Secondary | ICD-10-CM | POA: Insufficient documentation

## 2020-04-29 DIAGNOSIS — Z20822 Contact with and (suspected) exposure to covid-19: Secondary | ICD-10-CM | POA: Insufficient documentation

## 2020-04-29 LAB — POCT RAPID STREP A, ED / UC: Streptococcus, Group A Screen (Direct): NEGATIVE

## 2020-04-29 LAB — RESP PANEL BY RT-PCR (FLU A&B, COVID) ARPGX2
Influenza A by PCR: NEGATIVE
Influenza B by PCR: NEGATIVE
SARS Coronavirus 2 by RT PCR: NEGATIVE

## 2020-04-29 MED ORDER — DEXAMETHASONE 10 MG/ML FOR PEDIATRIC ORAL USE
10.0000 mg | Freq: Once | INTRAMUSCULAR | Status: AC
  Administered 2020-04-29: 10 mg via ORAL

## 2020-04-29 MED ORDER — DEXAMETHASONE SODIUM PHOSPHATE 10 MG/ML IJ SOLN
INTRAMUSCULAR | Status: AC
  Filled 2020-04-29: qty 1

## 2020-04-29 NOTE — ED Provider Notes (Signed)
____________________________________________  Time seen: Approximately 5:25 PM  I have reviewed the triage vital signs and the nursing notes.   HISTORY  Chief Complaint Sore Throat and Headache   Historian Patient    HPI Audrey Klein is a 29 y.o. female presents to the urgent care with pharyngitis, headache, malaise, body aches and nasal congestion for the past 2 to 3 days.  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  She has been able to manage her own secretions and can speak in complete sentences.  She denies chest pain, chest tightness or abdominal pain.  No recent travel.  No other alleviating measures have been attempted.  History reviewed. No pertinent past medical history.   Immunizations up to date:  Yes.     History reviewed. No pertinent past medical history.  There are no problems to display for this patient.   History reviewed. No pertinent surgical history.  Prior to Admission medications   Medication Sig Start Date End Date Taking? Authorizing Provider  erythromycin ophthalmic ointment Place a 1/2 inch ribbon of ointment into the lower eyelid. 08/07/19   Coralyn Mark, NP  naproxen (NAPROSYN) 500 MG tablet Take 1 tablet (500 mg total) by mouth 2 (two) times daily. 01/08/20   Henderly, Britni A, PA-C  ondansetron (ZOFRAN) 4 MG tablet Take 1 tablet (4 mg total) by mouth every 8 (eight) hours as needed for nausea or vomiting. 02/10/20   Georgetta Haber, NP    Allergies Patient has no known allergies.  Family History  Problem Relation Age of Onset  . Cancer Mother   . Healthy Father     Social History Social History   Tobacco Use  . Smoking status: Never Smoker  . Smokeless tobacco: Never Used  Vaping Use  . Vaping Use: Never used  Substance Use Topics  . Alcohol use: Yes  . Drug use: Never     Review of Systems  Constitutional: No fever/chills Eyes:  No discharge ENT: Patient has pharyngitis. Respiratory: no  cough. No SOB/ use of accessory muscles to breath Gastrointestinal:   No nausea, no vomiting.  No diarrhea.  No constipation. Musculoskeletal: Negative for musculoskeletal pain. Neuro: Patient has headache.  Skin: Negative for rash, abrasions, lacerations, ecchymosis.  ____________________________________________   PHYSICAL EXAM:  VITAL SIGNS: ED Triage Vitals  Enc Vitals Group     BP 04/29/20 1619 124/70     Pulse Rate 04/29/20 1619 78     Resp 04/29/20 1619 19     Temp 04/29/20 1619 98.4 F (36.9 C)     Temp Source 04/29/20 1619 Oral     SpO2 04/29/20 1619 100 %     Weight --      Height --      Head Circumference --      Peak Flow --      Pain Score 04/29/20 1614 10     Pain Loc --      Pain Edu? --      Excl. in GC? --      Constitutional: Alert and oriented. Patient is lying supine. Eyes: Conjunctivae are normal. PERRL. EOMI. Head: Atraumatic. ENT:      Ears: Tympanic membranes are mildly injected with mild effusion bilaterally.       Nose: No congestion/rhinnorhea.      Mouth/Throat: Mucous membranes are moist. Posterior pharynx is mildly erythematous.  Hematological/Lymphatic/Immunilogical: No cervical lymphadenopathy.  Cardiovascular: Normal rate, regular rhythm. Normal S1 and S2.  Good peripheral circulation. Respiratory: Normal respiratory effort without tachypnea or retractions. Lungs CTAB. Good air entry to the bases with no decreased or absent breath sounds. Gastrointestinal: Bowel sounds 4 quadrants. Soft and nontender to palpation. No guarding or rigidity. No palpable masses. No distention. No CVA tenderness. Musculoskeletal: Full range of motion to all extremities. No gross deformities appreciated. Neurologic:  Normal speech and language. No gross focal neurologic deficits are appreciated.  Skin:  Skin is warm, dry and intact. No rash noted. Psychiatric: Mood and affect are normal. Speech and behavior are normal. Patient exhibits appropriate insight and  judgement.    ____________________________________________   LABS (all labs ordered are listed, but only abnormal results are displayed)  Labs Reviewed - No data to display ____________________________________________  EKG   ____________________________________________  RADIOLOGY  No results found.  ____________________________________________    PROCEDURES  Procedure(s) performed:     Procedures     Medications - No data to display   ____________________________________________   INITIAL IMPRESSION / ASSESSMENT AND PLAN / ED COURSE  Pertinent labs & imaging results that were available during my care of the patient were reviewed by me and considered in my medical decision making (see chart for details).      Assessment and plan:  Pharyngitis Headache 29 year old female presents to the urgent care with headache and pharyngitis along with malaise, nasal congestion and rhinorrhea for the past 2 to 3 days.  Vital signs are reassuring at triage.  On physical exam, patient was alert, active and nontoxic-appearing.  She was managing her own secretions and able to speak in complete sentences.  Posterior pharynx appeared mildly erythematous with symmetrical tonsillar hypertrophy.  We will obtain group A strep, COVID-19 and influenza testing and will reassess.   Group A strep testing was negative.  Culture in process at this time.  COVID-19 and influenza testing are in process and patient feels comfortable awaiting results at home.  Rest and hydration were encouraged at home.  Patient was given oral Decadron for pharyngitis prior to discharge.  All patient questions were answered.   ____________________________________________  FINAL CLINICAL IMPRESSION(S) / ED DIAGNOSES  Final diagnoses:  None      NEW MEDICATIONS STARTED DURING THIS VISIT:  ED Discharge Orders    None          This chart was dictated using voice recognition software/Dragon.  Despite best efforts to proofread, errors can occur which can change the meaning. Any change was purely unintentional.     Orvil Feil, PA-C 04/29/20 1817

## 2020-05-02 LAB — CULTURE, GROUP A STREP (THRC)

## 2020-06-03 ENCOUNTER — Other Ambulatory Visit: Payer: Self-pay | Admitting: Family Medicine

## 2020-06-03 DIAGNOSIS — N644 Mastodynia: Secondary | ICD-10-CM

## 2020-06-16 ENCOUNTER — Encounter (HOSPITAL_COMMUNITY): Payer: Self-pay | Admitting: *Deleted

## 2020-06-16 ENCOUNTER — Other Ambulatory Visit: Payer: Self-pay

## 2020-06-16 ENCOUNTER — Ambulatory Visit (HOSPITAL_COMMUNITY)
Admission: EM | Admit: 2020-06-16 | Discharge: 2020-06-16 | Disposition: A | Discharge status: Home / Self Care | Payer: BC Managed Care – PPO | Attending: Urgent Care | Admitting: Urgent Care

## 2020-06-16 DIAGNOSIS — B349 Viral infection, unspecified: Secondary | ICD-10-CM | POA: Diagnosis not present

## 2020-06-16 DIAGNOSIS — Z20822 Contact with and (suspected) exposure to covid-19: Secondary | ICD-10-CM | POA: Insufficient documentation

## 2020-06-16 DIAGNOSIS — R531 Weakness: Secondary | ICD-10-CM | POA: Diagnosis present

## 2020-06-16 DIAGNOSIS — K0889 Other specified disorders of teeth and supporting structures: Secondary | ICD-10-CM | POA: Diagnosis not present

## 2020-06-16 MED ORDER — PSEUDOEPHEDRINE HCL 60 MG PO TABS
60.0000 mg | ORAL_TABLET | Freq: Three times a day (TID) | ORAL | 0 refills | Status: DC | PRN
Start: 2020-06-16 — End: 2022-06-13

## 2020-06-16 MED ORDER — PROMETHAZINE-DM 6.25-15 MG/5ML PO SYRP
5.0000 mL | ORAL_SOLUTION | Freq: Every evening | ORAL | 0 refills | Status: DC | PRN
Start: 2020-06-16 — End: 2022-06-13

## 2020-06-16 MED ORDER — CETIRIZINE HCL 10 MG PO TABS: 10.0000 mg | ORAL_TABLET | Freq: Every day | ORAL | 0 refills | Status: DC

## 2020-06-16 MED ORDER — NAPROXEN 500 MG PO TABS
500.0000 mg | ORAL_TABLET | Freq: Two times a day (BID) | ORAL | 0 refills | Status: DC
Start: 2020-06-16 — End: 2020-07-22

## 2020-06-16 MED ORDER — AMOXICILLIN-POT CLAVULANATE 875-125 MG PO TABS
1.0000 | ORAL_TABLET | Freq: Two times a day (BID) | ORAL | 0 refills | Status: DC
Start: 2020-06-16 — End: 2022-06-13

## 2020-06-16 MED ORDER — BENZONATATE 100 MG PO CAPS: 100.0000 mg | ORAL_CAPSULE | Freq: Three times a day (TID) | ORAL | 0 refills | Status: DC | PRN

## 2020-06-16 NOTE — Discharge Instructions (Addendum)
Make sure you schedule an appointment with a dentist/dental surgeon as soon as possible.  You may try some of the resources below.    [facility] [phone] extension [phone]  Dr. Lawrence Marseilles [phone] [address]  [place] [phone] [address] [facility].  [facility] [phone] extension [phone], [place], [place], [address] First come first serve for the first 10 clients.  May do simple extractions only, no wisdom teeth or surgery.  You may try the second for Thursday of the month starting at 6:30 AM.  [employer] [facility] You may call the school to see if they are still helping to provide dental care for emergent cases.   We will notify you of your COVID-19 test results as they arrive and may take between 24 to 48 hours.  I encourage you to sign up for MyChart if you have not already done so as this can be the easiest way for [place] to communicate results to you online or through a phone app.  In the meantime, if you develop worsening symptoms including fever, chest pain, shortness of breath despite our current treatment plan then please report to the emergency room as this may be a sign of worsening status from possible COVID-19 infection.  Otherwise, we will manage this as a viral syndrome. For sore throat or cough try using a honey-based tea. Use 3 teaspoons of honey with juice squeezed from half lemon. Place shaved pieces of ginger into 1/2-1 cup of water and warm over stove top. Then mix the ingredients and repeat every 4 hours as needed. Please take Tylenol 500mg -650mg  every 6 hours for aches and pains, fevers. Hydrate very well with at least 2 liters of water. Eat light meals such as soups to replenish electrolytes and soft fruits, veggies. Start an antihistamine like Zyrtec, Allegra or Claritin for postnasal drainage, sinus congestion.  You can take this together with pseudoephedrine (Sudafed) at a dose of 60 mg 2-3 times a day as needed  for the same kind of congestion.

## 2020-06-16 NOTE — ED Provider Notes (Signed)
[facility]   MRN: [id number] DOB: 25-Feb-1991  Subjective:   Audrey Klein is a 30 y.o. female presenting for 4 day history of acute onset right sided dental pain, lower right facial pain, broken tooth. Has also had COVID exposure.  Has had weakness, fatigue, coughing, right-sided neck pain.  Patient is not COVID vaccinated.  She also works in Teacher, music.  Denies smoking history, history of lung disorders.  No current facility-administered medications for this encounter.  Current Outpatient Medications:  .  erythromycin ophthalmic ointment, Place a 1/2 inch ribbon of ointment into the lower eyelid., Disp: 1 g, Rfl: 1 .  naproxen (NAPROSYN) 500 MG tablet, Take 1 tablet (500 mg total) by mouth 2 (two) times daily., Disp: 30 tablet, Rfl: 0 .  ondansetron (ZOFRAN) 4 MG tablet, Take 1 tablet (4 mg total) by mouth every 8 (eight) hours as needed for nausea or vomiting., Disp: 10 tablet, Rfl: 0   No Known Allergies  History reviewed. No pertinent past medical history.   History reviewed. No pertinent surgical history.  Family History  Problem Relation Age of Onset  . Cancer Mother   . Healthy Father     Social History   Tobacco Use  . Smoking status: Never Smoker  . Smokeless tobacco: Never Used  Vaping Use  . Vaping Use: Never used  Substance Use Topics  . Alcohol use: Yes  . Drug use: Never    ROS   Objective:   Vitals: BP 119/69 (BP Location: Right Arm)   Pulse 70   Temp 98.8 F (37.1 C) (Oral)   Resp 18   LMP 06/05/2020   SpO2 100%   Physical Exam Constitutional:      General: She is not in acute distress.    Appearance: Normal appearance. She is well-developed. She is not ill-appearing, toxic-appearing or diaphoretic.  HENT:     Head: Normocephalic and atraumatic.      Nose: Nose normal.     Mouth/Throat:     Mouth: Mucous membranes are moist.     Pharynx: No oropharyngeal exudate or posterior oropharyngeal erythema.   Eyes:      General: No scleral icterus.       Right eye: No discharge.        Left eye: No discharge.     Extraocular Movements: Extraocular movements intact.     Conjunctiva/sclera: Conjunctivae normal.     Pupils: Pupils are equal, round, and reactive to light.  Cardiovascular:     Rate and Rhythm: Normal rate and regular rhythm.     Pulses: Normal pulses.     Heart sounds: Normal heart sounds. No murmur heard. No friction rub. No gallop.   Pulmonary:     Effort: Pulmonary effort is normal. No respiratory distress.     Breath sounds: Normal breath sounds. No stridor. No wheezing, rhonchi or rales.  Skin:    General: Skin is warm and dry.     Findings: No rash.  Neurological:     General: No focal deficit present.     Mental Status: She is alert and oriented to person, place, and time.  Psychiatric:        Mood and Affect: Mood normal.        Behavior: Behavior normal.        Thought Content: Thought content normal.        Judgment: Judgment normal.    Assessment and Plan :   PDMP not reviewed this encounter.  1. Viral syndrome   2. Pain, dental     Start Augmentin for dental infection/abscess, use naproxen for pain and inflammation. Emphasized need for dental surgeon consult.  Otherwise, will manage for viral illness such as viral URI, viral syndrome, viral rhinitis, COVID-19. Counseled patient on nature of COVID-19 including modes of transmission, diagnostic testing, management and supportive care.  Offered scripts for symptomatic relief. COVID 19 testing is pending. Counseled patient on potential for adverse effects with medications prescribed/recommended today, ER and return-to-clinic precautions discussed, patient verbalized understanding.     Wallis Bamberg, PA-C 06/16/20 2017

## 2020-06-16 NOTE — ED Triage Notes (Signed)
Pt reports since Friday she has had coug ,feeling weak and dental pain.

## 2020-06-17 LAB — SARS CORONAVIRUS 2 (TAT 6-24 HRS): SARS Coronavirus 2: NEGATIVE

## 2020-07-21 ENCOUNTER — Other Ambulatory Visit: Payer: Self-pay

## 2020-07-21 ENCOUNTER — Emergency Department (HOSPITAL_COMMUNITY): Payer: BC Managed Care – PPO

## 2020-07-21 ENCOUNTER — Emergency Department (HOSPITAL_COMMUNITY)
Admission: EM | Admit: 2020-07-21 | Discharge: 2020-07-22 | Disposition: A | Discharge status: Home / Self Care | Payer: BC Managed Care – PPO | Attending: Emergency Medicine | Admitting: Emergency Medicine

## 2020-07-21 ENCOUNTER — Encounter (HOSPITAL_COMMUNITY): Payer: Self-pay

## 2020-07-21 DIAGNOSIS — R1084 Generalized abdominal pain: Secondary | ICD-10-CM | POA: Diagnosis not present

## 2020-07-21 DIAGNOSIS — Z8719 Personal history of other diseases of the digestive system: Secondary | ICD-10-CM | POA: Insufficient documentation

## 2020-07-21 DIAGNOSIS — R109 Unspecified abdominal pain: Secondary | ICD-10-CM

## 2020-07-21 LAB — URINALYSIS, ROUTINE W REFLEX MICROSCOPIC
Bilirubin Urine: NEGATIVE
Glucose, UA: NEGATIVE mg/dL
Ketones, ur: NEGATIVE mg/dL
Leukocytes,Ua: NEGATIVE
Nitrite: NEGATIVE
Protein, ur: NEGATIVE mg/dL
Specific Gravity, Urine: 1.031 — ABNORMAL HIGH (ref 1.005–1.030)
pH: 5 (ref 5.0–8.0)

## 2020-07-21 LAB — COMPREHENSIVE METABOLIC PANEL
ALT: 21 U/L (ref 0–44)
AST: 19 U/L (ref 15–41)
Albumin: 3.7 g/dL (ref 3.5–5.0)
Alkaline Phosphatase: 55 U/L (ref 38–126)
Anion gap: 4 — ABNORMAL LOW (ref 5–15)
BUN: 13 mg/dL (ref 6–20)
CO2: 32 mmol/L (ref 22–32)
Calcium: 8.8 mg/dL — ABNORMAL LOW (ref 8.9–10.3)
Chloride: 104 mmol/L (ref 98–111)
Creatinine, Ser: 0.94 mg/dL (ref 0.44–1.00)
GFR, Estimated: >60 mL/min (ref >60)
Glucose, Bld: 96 mg/dL (ref 70–99)
Potassium: 3.4 mmol/L — ABNORMAL LOW (ref 3.5–5.1)
Sodium: 140 mmol/L (ref 135–145)
Total Bilirubin: 0.5 mg/dL (ref 0.3–1.2)
Total Protein: 7.6 g/dL (ref 6.5–8.1)

## 2020-07-21 LAB — CBC
HCT: 39.2 % (ref 36.0–46.0)
Hemoglobin: 12.8 g/dL (ref 12.0–15.0)
MCH: 29.8 pg (ref 26.0–34.0)
MCHC: 32.7 g/dL (ref 30.0–36.0)
MCV: 91.4 fL (ref 80.0–100.0)
Platelets: 381 10*3/uL (ref 150–400)
RBC: 4.29 MIL/uL (ref 3.87–5.11)
RDW: 12.5 % (ref 11.5–15.5)
WBC: 10.5 10*3/uL (ref 4.0–10.5)
nRBC: 0 % (ref 0.0–0.2)

## 2020-07-21 LAB — I-STAT BETA HCG BLOOD, ED (MC, WL, AP ONLY): I-stat hCG, quantitative: <5 m[IU]/mL (ref <5)

## 2020-07-21 LAB — LIPASE, BLOOD: Lipase: 28 U/L (ref 11–51)

## 2020-07-21 MED ORDER — IOHEXOL 300 MG/ML  SOLN
100.0000 mL | Freq: Once | INTRAMUSCULAR | Status: AC | PRN
  Administered 2020-07-21: 100 mL via INTRAVENOUS

## 2020-07-21 MED ORDER — KETOROLAC TROMETHAMINE 60 MG/2ML IM SOLN
60.0000 mg | Freq: Once | INTRAMUSCULAR | Status: AC
  Administered 2020-07-22: 60 mg via INTRAMUSCULAR
  Filled 2020-07-21: qty 2

## 2020-07-21 NOTE — ED Provider Notes (Signed)
[facility]-EMERGENCY DEPT Provider Note   CSN: [id number] Arrival date & time: 07/21/20  1759     History Chief Complaint  Patient presents with  . Abdominal Pain    Audrey Klein is a 30 y.o. female with no relevant past medical history presents the ED with a 1 day history of abdominal pain.  Patient informed triage of personal history of "problems" with her appendix and pancreas.  I cannot see any documentation to support this in her chart.  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  She states that yesterday she developed generalized abdominal discomfort, most notably on left side and in suprapubic region.  She states that today it was 15 out of 10 pain that caused her to double over at work.  She endorses mild nausea and diminished p.o. intake, but denies any emesis.  She had a bowel movement yesterday and again today, soft and normal.  She did not look to see if there is any black or red.  She denies any fevers or chills at home.  She endorses a mild headache, but denies any cough, congestion, shortness of breath, or other symptoms suggestive of viral infection.  Patient is also adamantly denying any vaginal bleeding, vaginal discharge, pelvic pain, dyspareunia, or concern for STD.  Declining pelvic exam here in the ED despite her lower abdominal pain.  She has not yet established with a primary care provider.  She does have [employer] and I strongly encouraged her to get established with one after today's encounter.  HPI     History reviewed. No pertinent past medical history.  There are no problems to display for this patient.   History reviewed. No pertinent surgical history.   OB History   No obstetric history on file.     Family History  Problem Relation Age  of Onset  . Cancer Mother   . Healthy Father     Social History   Tobacco Use  . Smoking status: Never Smoker  . Smokeless tobacco: Never Used  Vaping Use  . Vaping Use: Never used  Substance Use Topics  . Alcohol use: Yes  . Drug use: Never    Home Medications Prior to Admission medications   Medication Sig Start Date End Date Taking? Authorizing Provider  benzonatate (TESSALON) 100 MG capsule Take 1-2 capsules (100-200 mg total) by mouth 3 (three) times daily as needed for cough. 06/16/20  Yes Wallis Bamberg, PA-C  naproxen (NAPROSYN) 500 MG tablet Take 1 tablet (500 mg total) by mouth 2 (two) times daily between meals as needed for moderate pain. 07/22/20  Yes Lorelee New, PA-C  amoxicillin-clavulanate (AUGMENTIN) 875-125 MG tablet Take 1 tablet by mouth every 12 (twelve) hours. Patient not taking: Reported on 07/21/2020 06/16/20   Wallis Bamberg, PA-C  cetirizine (ZYRTEC ALLERGY) 10 MG tablet Take 1 tablet (10 mg total) by mouth daily. Patient not taking: Reported on 07/21/2020 06/16/20   Wallis Bamberg, PA-C  erythromycin ophthalmic ointment Place a 1/2 inch ribbon of ointment into the lower eyelid. Patient not taking: Reported on 07/21/2020 08/07/19   Coralyn Mark, NP  ondansetron (ZOFRAN) 4 MG tablet Take 1 tablet (4 mg total) by mouth every 8 (eight) hours as needed for nausea or vomiting. Patient not taking: Reported on 07/21/2020 02/10/20  Georgetta Haber, NP  promethazine-dextromethorphan (PROMETHAZINE-DM) 6.25-15 MG/5ML syrup Take 5 mLs by mouth at bedtime as needed for cough. Patient not taking: Reported on 07/21/2020 06/16/20   Wallis Bamberg, PA-C  pseudoephedrine (SUDAFED) 60 MG tablet Take 1 tablet (60 mg total) by mouth every 8 (eight) hours as needed for congestion. Patient not taking: Reported on 07/21/2020 06/16/20   Wallis Bamberg, PA-C    Allergies    Patient has no known allergies.  Review of Systems   Review of Systems  All other systems reviewed and are  negative.   Physical Exam Updated Vital Signs BP (!) 166/125   Pulse 77   Temp 98.8 F (37.1 C) (Oral)   Resp 20   LMP 07/10/2020 (Approximate)   SpO2 100%   Physical Exam Vitals and nursing note reviewed. Exam conducted with a chaperone present.  Constitutional:      General: She is not in acute distress.    Appearance: She is not toxic-appearing.  HENT:     Head: Normocephalic and atraumatic.  Eyes:     General: No scleral icterus.    Conjunctiva/sclera: Conjunctivae normal.  Cardiovascular:     Rate and Rhythm: Normal rate and regular rhythm.     Pulses: Normal pulses.  Pulmonary:     Effort: Pulmonary effort is normal. No respiratory distress.  Abdominal:     General: Abdomen is flat. There is no distension.     Palpations: Abdomen is soft. There is no mass.     Tenderness: There is abdominal tenderness. There is no guarding.     Hernia: No hernia is present.     Comments: Generalized tenderness palpation over entire abdomen.  No guarding.  Soft, nondistended.  TTP most notable in suprapubic, LLQ, and LUQ regions.  NABS.  No overlying skin changes.  Skin:    General: Skin is dry.  Neurological:     Mental Status: She is alert.     GCS: GCS eye subscore is 4. GCS verbal subscore is 5. GCS motor subscore is 6.  Psychiatric:        Mood and Affect: Mood normal.        Behavior: Behavior normal.        Thought Content: Thought content normal.     ED Results / Procedures / Treatments   Labs (all labs ordered are listed, but only abnormal results are displayed) Labs Reviewed  COMPREHENSIVE METABOLIC PANEL - Abnormal; Notable for the following components:      Result Value   Potassium 3.4 (*)    Calcium 8.8 (*)    Anion gap 4 (*)    All other components within normal limits  URINALYSIS, ROUTINE W REFLEX MICROSCOPIC - Abnormal; Notable for the following components:   APPearance HAZY (*)    Specific Gravity, Urine 1.031 (*)    Hgb urine dipstick SMALL (*)     Bacteria, UA RARE (*)    All other components within normal limits  LIPASE, BLOOD  CBC  I-STAT BETA HCG BLOOD, ED ([facility], [facility], [facility] ONLY)    EKG None  Radiology CT ABDOMEN PELVIS W CONTRAST  Result Date: 07/21/2020 CLINICAL DATA:  30 year old female with concern for acute diverticulitis. EXAM: CT ABDOMEN AND PELVIS WITH CONTRAST TECHNIQUE: Multidetector CT imaging of the abdomen and pelvis was performed using the standard protocol following bolus administration of intravenous contrast. CONTRAST:  OMNIPAQUE IOHEXOL 300 MG/ML  SOLN COMPARISON:  None. FINDINGS: Lower chest: The visualized lung bases are clear.  No intra-abdominal free air or free fluid. Hepatobiliary: Fatty liver. No intrahepatic biliary dilatation. No calcified gallstone or pericholecystic fluid. Pancreas: Unremarkable. No pancreatic ductal dilatation or surrounding inflammatory changes. Spleen: Normal in size without focal abnormality. Adrenals/Urinary Tract: The adrenal glands unremarkable. The kidneys, visualized ureters, and urinary bladder appear unremarkable Stomach/Bowel: There is no bowel obstruction or active inflammation. The appendix is normal. Vascular/Lymphatic: The abdominal aorta and IVC unremarkable. No portal venous gas. There is no adenopathy. Reproductive: The uterus is anteverted and grossly unremarkable. No adnexal masses. Other: Small fat containing umbilical hernia. Musculoskeletal: No acute or significant osseous findings. IMPRESSION: 1. No acute intra-abdominal or pelvic pathology. No bowel obstruction. Normal appendix. 2. Fatty liver. Electronically Signed   By: Elgie CollardArash  Radparvar M.D.   On: 07/21/2020 23:48    Procedures Procedures   Medications Ordered in ED Medications  ketorolac (TORADOL) injection 60 mg (has no administration in time range)  iohexol (OMNIPAQUE) 300 MG/ML solution 100 mL (100 mLs Intravenous Contrast Given 07/21/20 2316)    ED Course  I have reviewed the triage vital signs and the  nursing notes.  Pertinent labs & imaging results that were available during my care of the patient were reviewed by me and considered in my medical decision making (see chart for details).    MDM Rules/Calculators/A&P                          Audrey Rummagebony Yerger was evaluated in Emergency Department on 07/22/2020 for the symptoms described in the history of present illness. She was evaluated in the context of the global COVID-19 pandemic, which necessitated consideration that the patient might be at risk for infection with the SARS-CoV-2 virus that causes COVID-19. Institutional protocols and algorithms that pertain to the evaluation of patients at risk for COVID-19 are in a state of rapid change based on information released by regulatory bodies including the CDC and federal and state organizations. These policies and algorithms were followed during the patient's care in the ED.  I personally reviewed patient's medical chart and all notes from triage and staff during today's encounter. I have also ordered and reviewed all labs and imaging that I felt to be medically necessary in the evaluation of this patient's complaints and with consideration of their physical exam. If needed, translation services were available and utilized.   Patient with complaints of severe generalized abdominal pain that had her "doubling over" while at work today.  She states that her pain symptoms were 15 out of 10.  She drove here and understands that she cannot be treated with narcotic medications or NSAIDs in the event that her abdominal pain was surgical.  She adamantly denies any pelvic pain, discharge, bleeding, dyspareunia, concern for STD, or symptoms otherwise concerning for pelvic etiology.  She is declining pelvic exam here in the ED.  Her laboratory work-up is largely unremarkable.  She does have borderline leukocytosis with WBC of 10.5.  No other significant derangement.  UA is without any evidence to suggest infection.   Patient having bowel movements, no history of abdominal surgeries.  Normoactive bowel sounds.  Doubt obstruction.    We engaged in shared decision-making regarding the to pursue abdominal CT.  Her abdominal pain is seemingly nonspecific and she had generalized tenderness, but was most notable in the left side of abdomen and suprapubic region.  Cannot exclude diverticulitis as cause for her symptoms.  Given her pain symptoms, will obtain imaging to assess for  complication.  No history of diverticular disease.  Patient would like to proceed with imaging and understands the associated risks.    CT abdomen pelvis is personally reviewed and without any acute intra-abdominal or pelvic pathology.  Appendix visualized and normal.  No diverticular disease.  On subsequent evaluation, patient is reassured by today's comprehensive work-up.  Will administer Toradol 60 mg IM for pain symptoms.  We will discharge her home with naproxen because that has helped her in the past.  She will follow up with a primary care provider.  ED return precautions discussed.  Patient voiced understanding is agreeable to the plan.  Blood pressures are mildly elevated here in the ED, but she states that it is likely related to her discomfort.  She understands the need to get established with a primary care provider who accepts her [employer] for ongoing evaluation and management.  Patient is without symptoms concerning for end organ damage. I discussed with patient their elevated blood pressure and need for close outpatient management of their hypertension. Patient counseled on long-term effects of elevated BP including kidney damage, vascular damage, retinopathy, and risk of stroke and other dangerous outcomes. Patient understanding of close PCP follow up.   Final Clinical Impression(s) / ED Diagnoses Final diagnoses:  Nonspecific abdominal pain    Rx / DC Orders ED Discharge Orders         Ordered    naproxen (NAPROSYN) 500  MG tablet  2 times daily between meals PRN        07/22/20 0003           Lorelee New, PA-C 07/22/20 Levora Angel, MD 08/03/20 1217

## 2020-07-21 NOTE — ED Triage Notes (Signed)
Pt presents with c/o abdominal pain that started yesterday. Pt reports the pain is worse today than it was yesterday. Pt reports hx of problems with both her appendix and her pancreas, although pt does report that she does still have her appendix.

## 2020-07-21 NOTE — Discharge Instructions (Addendum)
Please get established with a primary care provider soon as possible for ongoing evaluation and management.  Your blood pressures were elevated here in the ED.  I suspect that is in the context of your discomfort, however I would like for you to be proactive with outpatient follow-up.  Your abdominal pain is nonspecific.  Many cases can be observed and treated at home after initial evaluation in the emergency department. Even though you are being discharged home, abdominal pain can be unpredictable. Therefore, you need a repeat exam if your pain does not resolve, if it returns, or worsens. Many abdominal conditions cannot be diagnosed in one visit, so follow-up evaluations are very important.  Please seek immediate care if you have any develop any of the following symptoms: The pain does not improve or gets worse.  You develop a fever or chills.  You keep throwing up (vomiting).  The pain is felt only in portions of the abdomen.  You pass bloody or black tarry stools.   You do not have a bowel movement for more than 4 days.  You have profuse (greater than 4/day), loose stools for more than 4 days.

## 2020-07-22 MED ORDER — NAPROXEN 500 MG PO TABS
500.0000 mg | ORAL_TABLET | Freq: Two times a day (BID) | ORAL | 0 refills | Status: DC | PRN
Start: 2020-07-22 — End: 2022-06-13

## 2020-09-02 ENCOUNTER — Other Ambulatory Visit: Payer: Self-pay

## 2020-09-02 ENCOUNTER — Encounter (HOSPITAL_COMMUNITY): Payer: Self-pay

## 2020-09-02 ENCOUNTER — Emergency Department (HOSPITAL_COMMUNITY): Payer: Self-pay

## 2020-09-02 DIAGNOSIS — M546 Pain in thoracic spine: Secondary | ICD-10-CM | POA: Insufficient documentation

## 2020-09-02 DIAGNOSIS — M791 Myalgia, unspecified site: Secondary | ICD-10-CM | POA: Insufficient documentation

## 2020-09-02 DIAGNOSIS — R079 Chest pain, unspecified: Secondary | ICD-10-CM | POA: Insufficient documentation

## 2020-09-02 NOTE — ED Triage Notes (Signed)
Patient arrived with complaints of left sided chest pain that started Sunday. Declines any NV, shob, or dizziness. Declines taking any OTC medication prior to arrival.

## 2020-09-02 NOTE — ED Provider Notes (Signed)
MSE was initiated and I personally evaluated the patient and placed orders (if any) at  10:08 PM on September 02, 2020.  Patient to ED with progressively worsening chest pain x 3 days that involves the left upper back. Worse with movement. No cough, fever, sob. History of same, "minor heart attack".   Well appearing. Cardiac RRR, lungs clear. Chest and upper back tender to palpation.    The patient appears stable so that the remainder of the MSE may be completed by another provider.   Elpidio Anis, PA-C 09/02/20 2209    Derwood Kaplan, MD 09/03/20 0030

## 2020-09-03 ENCOUNTER — Emergency Department (HOSPITAL_COMMUNITY)
Admission: EM | Admit: 2020-09-03 | Discharge: 2020-09-03 | Disposition: A | Discharge status: Home / Self Care | Payer: Self-pay | Attending: Emergency Medicine | Admitting: Emergency Medicine

## 2020-09-03 DIAGNOSIS — M7918 Myalgia, other site: Secondary | ICD-10-CM

## 2020-09-03 MED ORDER — METHOCARBAMOL 500 MG PO TABS
500.0000 mg | ORAL_TABLET | Freq: Two times a day (BID) | ORAL | 0 refills | Status: DC
Start: 2020-09-03 — End: 2022-06-13

## 2020-09-03 MED ORDER — IBUPROFEN 600 MG PO TABS
600.0000 mg | ORAL_TABLET | Freq: Four times a day (QID) | ORAL | 0 refills | Status: AC | PRN
Start: 2020-09-03 — End: ?

## 2020-09-03 NOTE — Discharge Instructions (Addendum)
Take the medications as prescribed. Recommend heat therapy to the sore areas as well.   Follow up for primary care with [facility] or with a provider of your choice.   Return to the emergency department with any new or worsening symptoms.

## 2020-09-03 NOTE — ED Provider Notes (Signed)
[facility]-EMERGENCY DEPT Provider Note   CSN: [id number] Arrival date & time: 09/02/20  2147     History No chief complaint on file.   Audrey Klein is a 30 y.o. female.  Patient to ED with complaint of left sided chest and left upper back pain x 3 days. Worse with movement or palpation. No fever, cough or shortness of breath. No nausea, vomiting. She took Aleve without relief. Reports a history of "minor heart attack" in the past, treated at [facility].   The history is provided by the patient. No language interpreter was used.       History reviewed. No pertinent past medical history.  There are no problems to display for this patient.   History reviewed. No pertinent surgical history.   OB History   No obstetric history on file.     Family History  Problem Relation Age of Onset  . Cancer Mother   . Healthy Father     Social History   Tobacco Use  . Smoking status: Never Smoker  . Smokeless tobacco: Never Used  Vaping Use  . Vaping Use: Never used  Substance Use Topics  . Alcohol use: Yes  . Drug use: Never    Home Medications Prior to Admission medications   Medication Sig Start Date End Date Taking? Authorizing Provider  amoxicillin-clavulanate (AUGMENTIN) 875-125 MG tablet Take 1 tablet by mouth every 12 (twelve) hours. Patient not taking: No sig reported 06/16/20   Wallis Bamberg, PA-C  benzonatate (TESSALON) 100 MG capsule Take 1-2 capsules (100-200 mg total) by mouth 3 (three) times daily as needed for cough. Patient not taking: No sig reported 06/16/20   Wallis Bamberg, PA-C  cetirizine (ZYRTEC ALLERGY) 10 MG tablet Take 1 tablet (10 mg total) by mouth daily. Patient not taking: No sig reported 06/16/20   Wallis Bamberg, PA-C  erythromycin ophthalmic ointment Place a 1/2 inch ribbon of ointment into the lower eyelid. Patient not taking: No sig reported 08/07/19   Coralyn Mark, NP  naproxen (NAPROSYN) 500 MG tablet Take 1 tablet (500 mg  total) by mouth 2 (two) times daily between meals as needed for moderate pain. Patient not taking: Reported on 09/03/2020 07/22/20   Lorelee New, PA-C  ondansetron (ZOFRAN) 4 MG tablet Take 1 tablet (4 mg total) by mouth every 8 (eight) hours as needed for nausea or vomiting. Patient not taking: No sig reported 02/10/20   Linus Mako B, NP  promethazine-dextromethorphan (PROMETHAZINE-DM) 6.25-15 MG/5ML syrup Take 5 mLs by mouth at bedtime as needed for cough. Patient not taking: No sig reported 06/16/20   Wallis Bamberg, PA-C  pseudoephedrine (SUDAFED) 60 MG tablet Take 1 tablet (60 mg total) by mouth every 8 (eight) hours as needed for congestion. Patient not taking: No sig reported 06/16/20   Wallis Bamberg, PA-C    Allergies    Patient has no known allergies.  Review of Systems   Review of Systems  Constitutional: Negative for chills and fever.  HENT: Negative.   Respiratory: Negative.  Negative for cough and shortness of breath.   Cardiovascular: Positive for chest pain.  Gastrointestinal: Negative.   Musculoskeletal: Positive for back pain.  Skin: Negative.   Neurological: Negative.     Physical Exam Updated Vital Signs BP (!) 142/95 (BP Location: Left Arm)   Pulse 69   Temp 98.5 F (36.9 C) (Oral)   Resp (!) 21   LMP 08/19/2020   SpO2 100%   Physical Exam Vitals and  nursing note reviewed.  Constitutional:      Appearance: She is well-developed.  HENT:     Head: Normocephalic.  Cardiovascular:     Rate and Rhythm: Normal rate and regular rhythm.  Pulmonary:     Effort: Pulmonary effort is normal.     Breath sounds: Normal breath sounds. No wheezing, rhonchi or rales.  Chest:     Chest wall: Tenderness (left chest wall tender to even light palpation. No swelling or redness. ) present.  Abdominal:     General: Bowel sounds are normal.     Palpations: Abdomen is soft.     Tenderness: There is no abdominal tenderness. There is no guarding or rebound.  Musculoskeletal:         General: Normal range of motion.     Cervical back: Normal range of motion and neck supple.       Back:     Comments: Tenderness over left thoracic back. No swelling, bony deformity. No midline tenderness.   Skin:    General: Skin is warm and dry.     Findings: No rash.  Neurological:     Mental Status: She is alert and oriented to person, place, and time.     ED Results / Procedures / Treatments   Labs (all labs ordered are listed, but only abnormal results are displayed) Labs Reviewed - No data to display  EKG EKG Interpretation  Date/Time:  Wednesday September 02 2020 21:59:02 EDT Ventricular Rate:  86 PR Interval:  148 QRS Duration: 90 QT Interval:  350 QTC Calculation: 419 R Axis:   53 Text Interpretation: Sinus rhythm 12 Lead; Mason-Likar Confirmed by Marianna Fuss ([id number]) on 09/03/2020 3:48:49 AM   Radiology DG Chest 2 View  Result Date: 09/02/2020 CLINICAL DATA:  30 year old female with chest pain. EXAM: CHEST - 2 VIEW COMPARISON:  Chest radiograph dated 01/07/2020 FINDINGS: No focal consolidation, pleural effusion, pneumothorax. The cardiac silhouette is within limits. No acute osseous pathology. IMPRESSION: No active cardiopulmonary disease. Electronically Signed   By: Elgie Collard M.D.   On: 09/02/2020 22:38    Procedures Procedures patient to   Medications Ordered in ED Medications - No data to display  ED Course  I have reviewed the triage vital signs and the nursing notes.  Pertinent labs & imaging results that were available during my care of the patient were reviewed by me and considered in my medical decision making (see chart for details).    MDM Rules/Calculators/A&P                          Patient to ED with ss/sxs as per HPI.   Chest and back pain are very reproducible, worse with movement. No infectious symptoms. EKG NSR, no ischemia. CXR clear.   Chart reviewed. No consultations by cardiology in the past. Symptoms atypical for  MI/ACS. Lab tests are not felt indicated. Will treat MSK pain. Will provide PCP referrals.   Final Clinical Impression(s) / ED Diagnoses Final diagnoses:  None   1. Musculoskeletal pain  Rx / DC Orders ED Discharge Orders    None       Elpidio Anis, PA-C 09/03/20 0350    Milagros Loll, MD 09/04/20 [phone]

## 2021-08-04 IMAGING — CR DG CHEST 2V
2 series · 2 of 2 positions shown · non-contrast
Comparison: None.

CLINICAL DATA: Initial evaluation for acute chest pain.

EXAM:
CHEST - 2 VIEW

[chest pa]
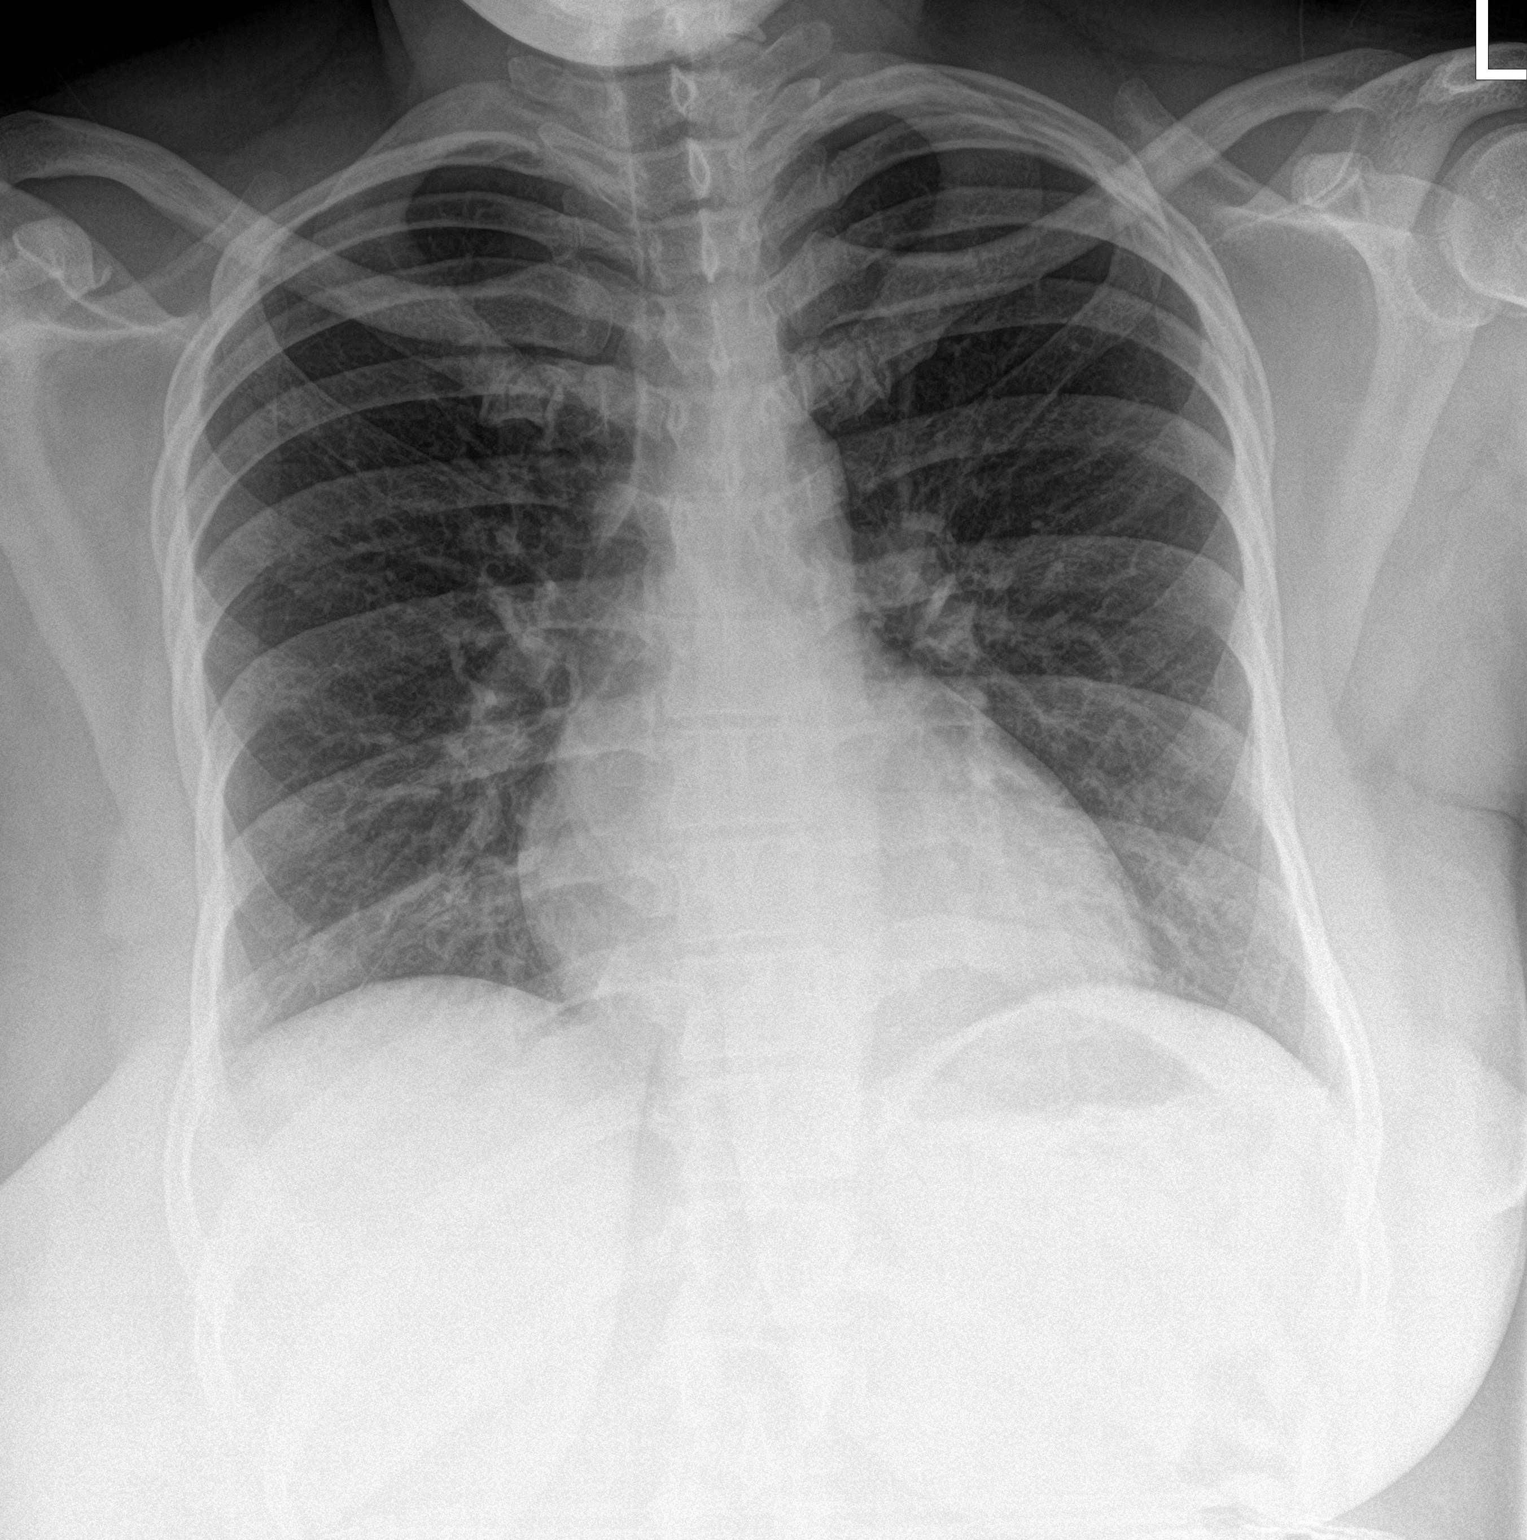

[chest lat]
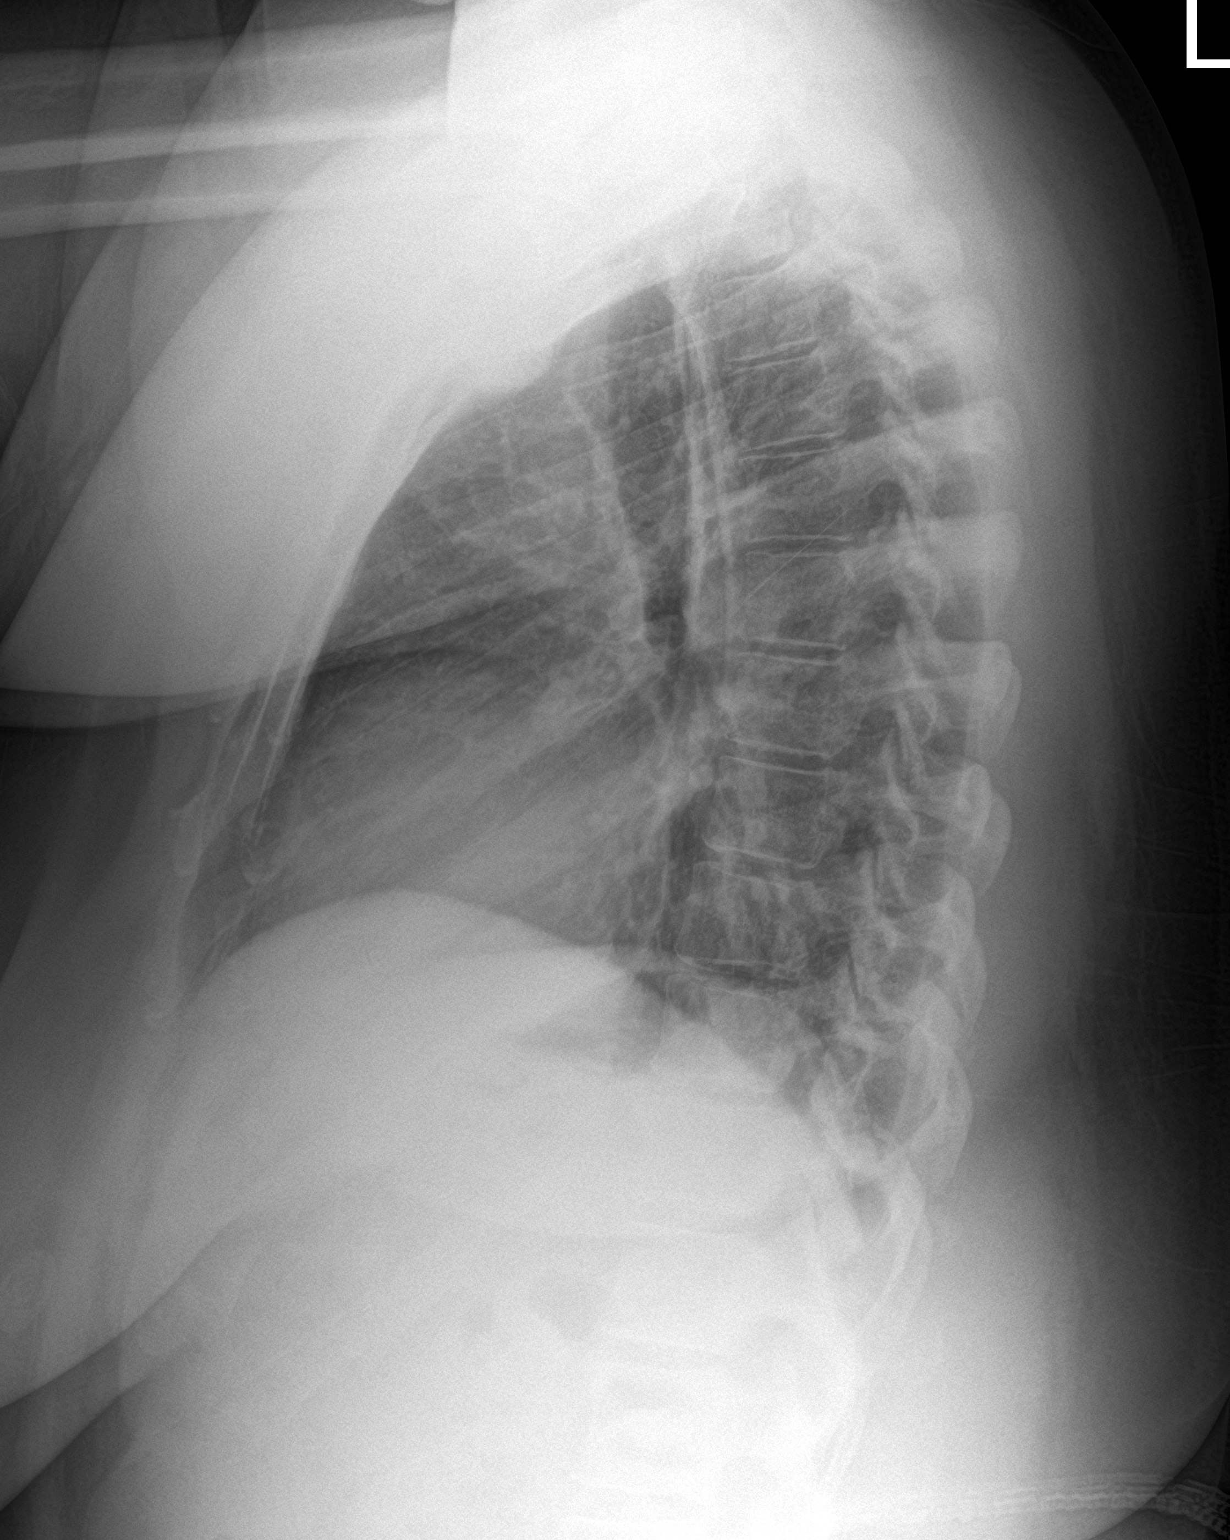

[2 of 2 positions shown; findings below may reference images not displayed]

FINDINGS: The cardiac and mediastinal silhouettes are within normal limits.

The lungs are normally inflated. No airspace consolidation, pleural
effusion, or pulmonary edema. No pneumothorax.

No acute osseous abnormality.
IMPRESSION: No active cardiopulmonary disease.

## 2021-08-05 IMAGING — CR DG CHEST 2V
2 series · 2 of 2 positions shown · non-contrast
Comparison: PA and lateral chest 01/06/2020.

CLINICAL DATA: Chest pain and headache for 5 days.

EXAM:
CHEST - 2 VIEW

[w chest pa]
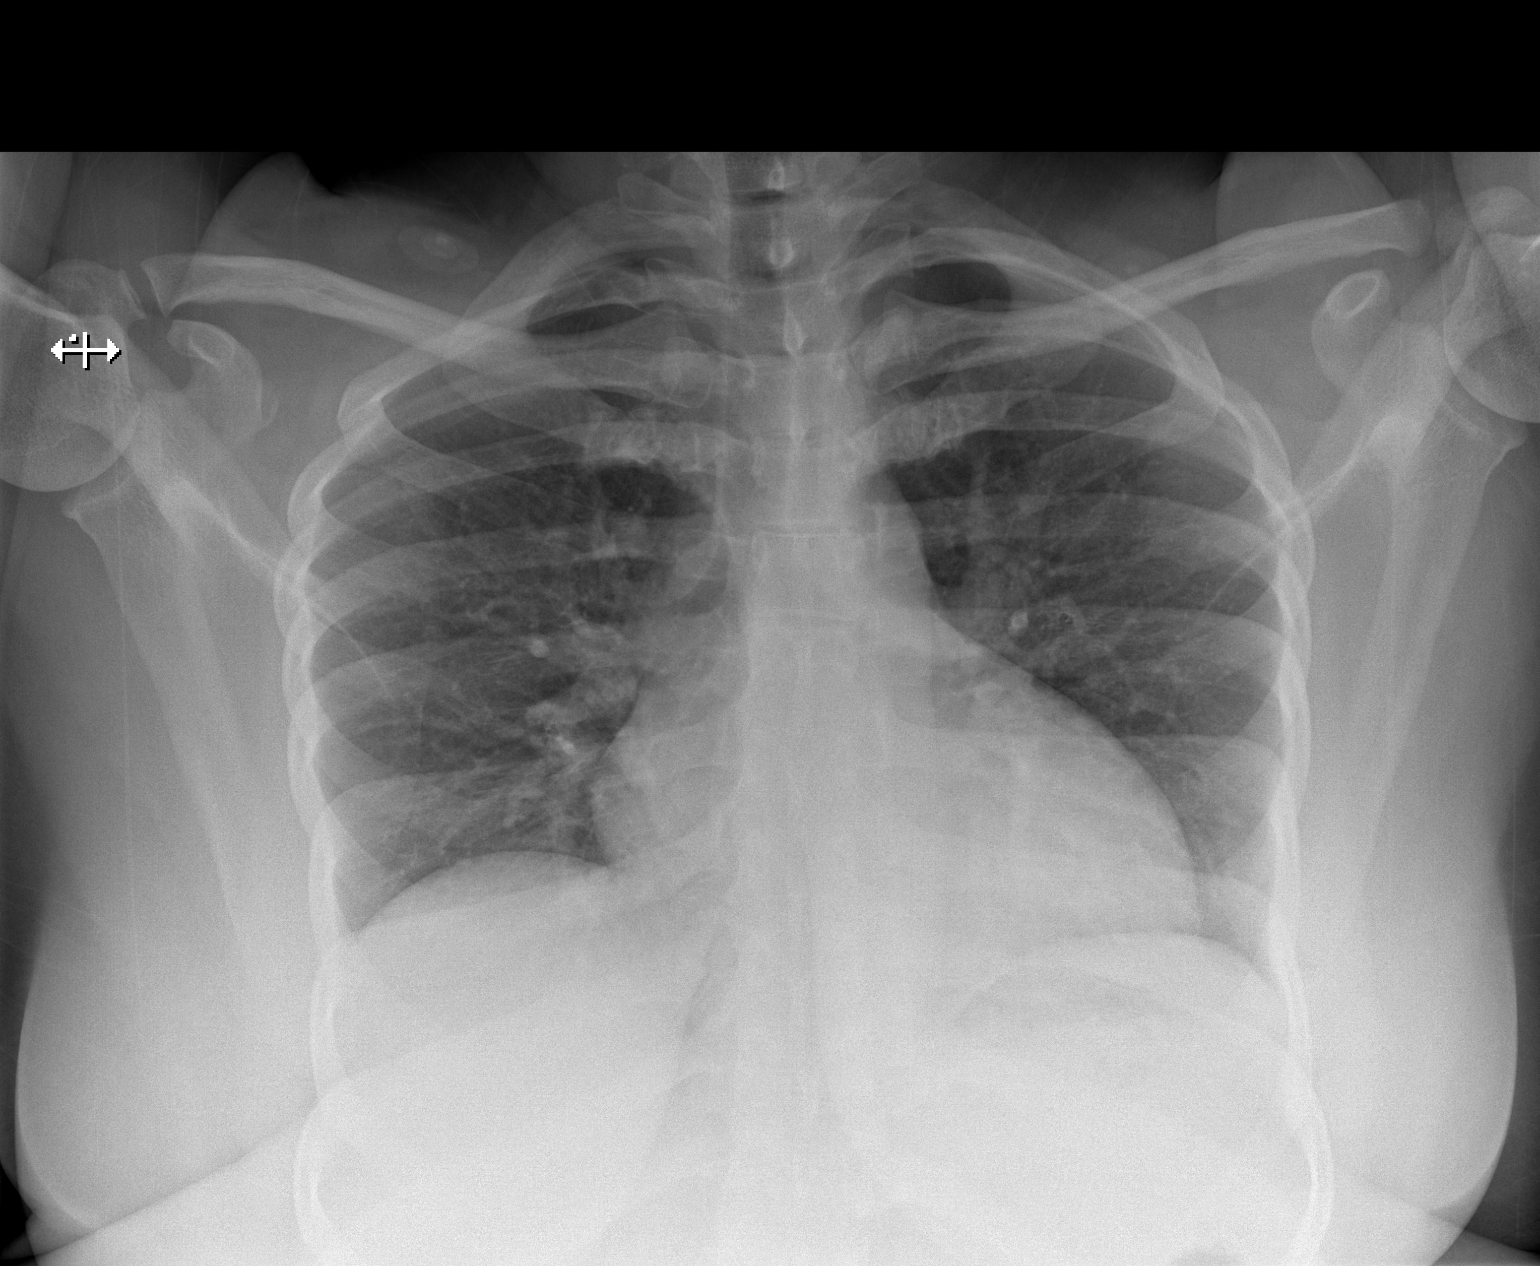

[w chest lat]
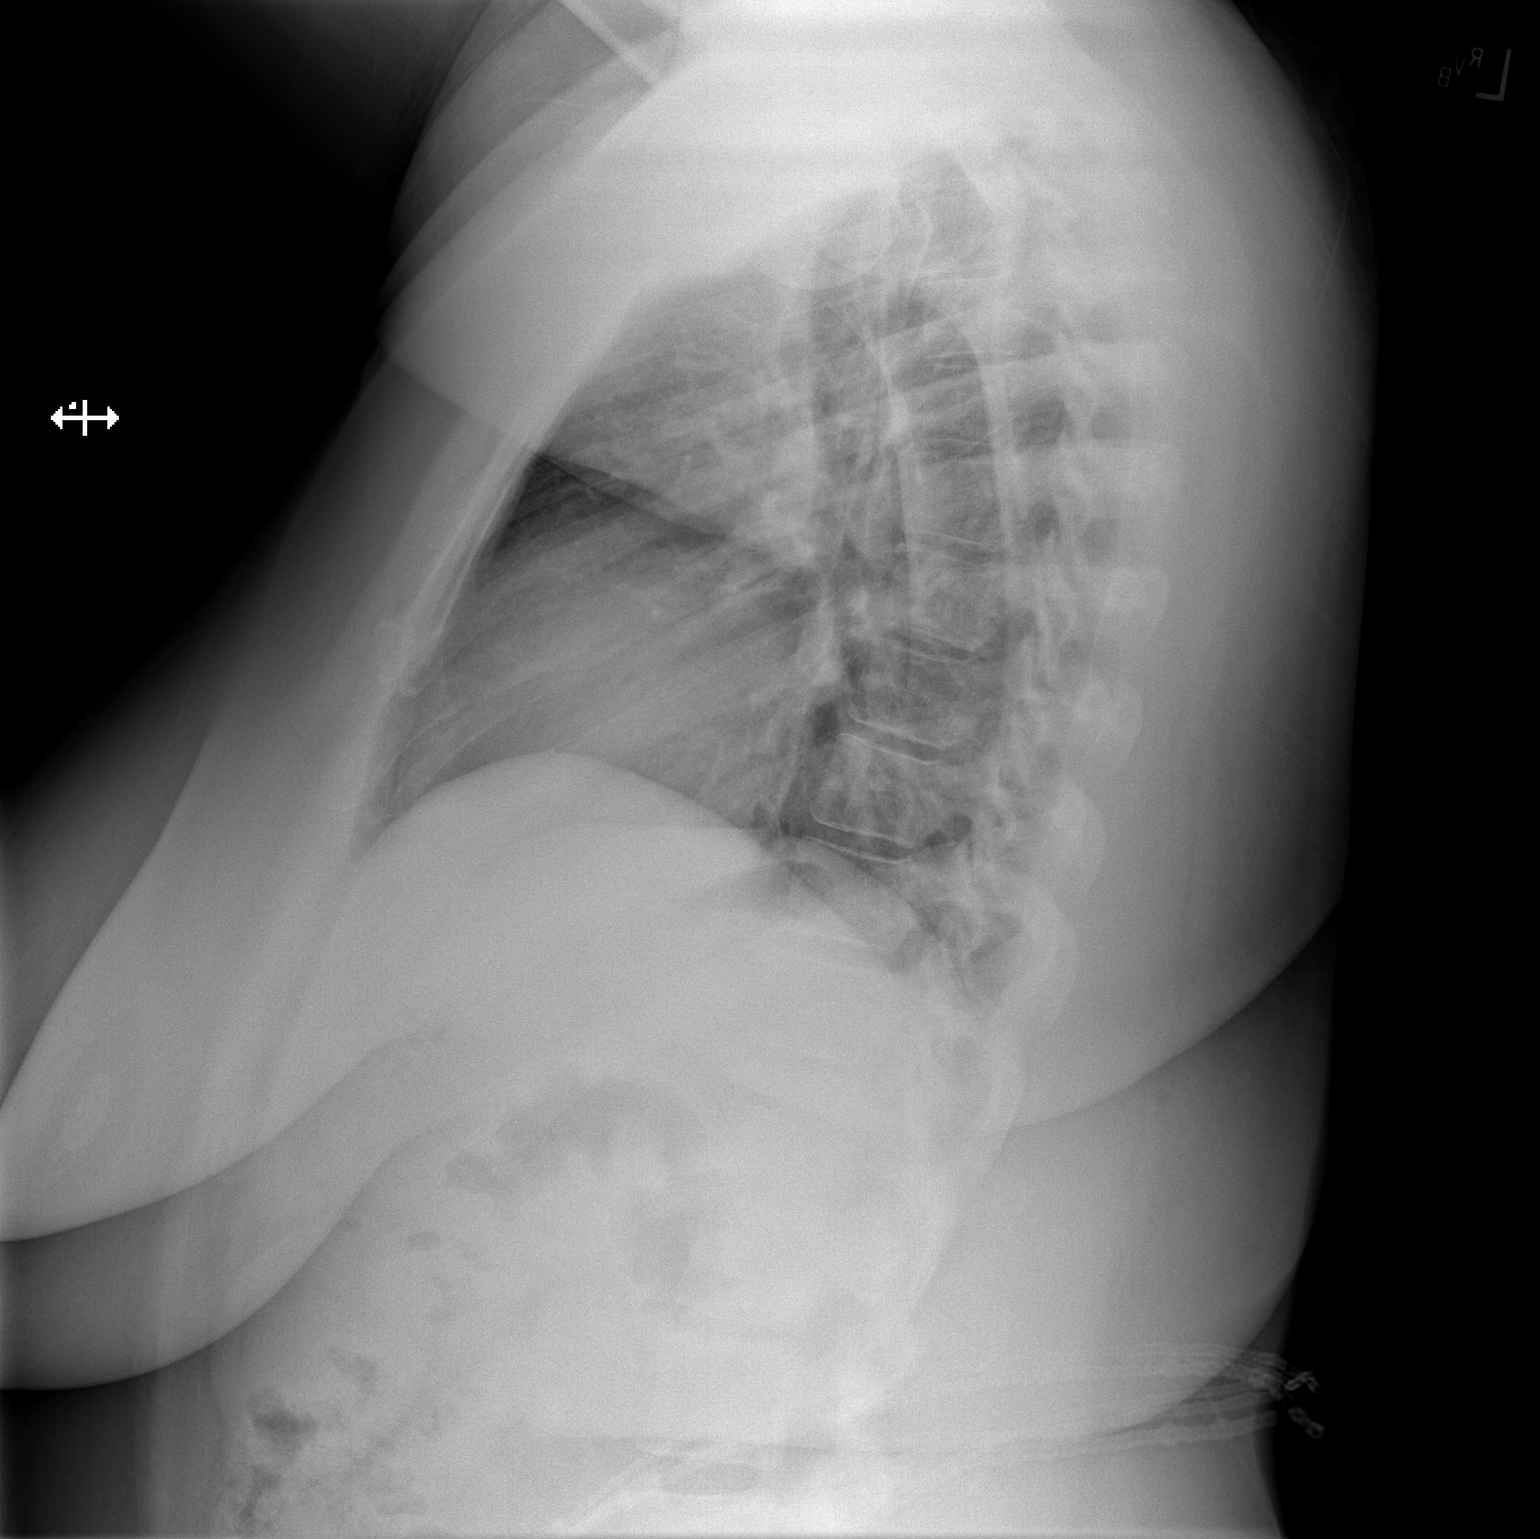

[2 of 2 positions shown; findings below may reference images not displayed]

FINDINGS: Lungs clear. Heart size normal. No pneumothorax or pleural fluid. No
acute or focal bony abnormality.
IMPRESSION: Normal chest.

## 2021-11-15 ENCOUNTER — Encounter (HOSPITAL_COMMUNITY): Payer: Self-pay

## 2021-11-15 ENCOUNTER — Ambulatory Visit (HOSPITAL_COMMUNITY)
Admission: EM | Admit: 2021-11-15 | Discharge: 2021-11-15 | Disposition: A | Discharge status: Home / Self Care | Payer: Self-pay | Attending: Physician Assistant | Admitting: Physician Assistant

## 2021-11-15 DIAGNOSIS — H5712 Ocular pain, left eye: Secondary | ICD-10-CM

## 2021-11-15 DIAGNOSIS — G43009 Migraine without aura, not intractable, without status migrainosus: Secondary | ICD-10-CM

## 2021-11-15 DIAGNOSIS — S0502XA Injury of conjunctiva and corneal abrasion without foreign body, left eye, initial encounter: Secondary | ICD-10-CM

## 2021-11-15 DIAGNOSIS — R110 Nausea: Secondary | ICD-10-CM

## 2021-11-15 MED ORDER — EYE WASH OP SOLN
OPHTHALMIC | Status: AC
  Filled 2021-11-15: qty 118

## 2021-11-15 MED ORDER — TETRACAINE HCL 0.5 % OP SOLN
OPHTHALMIC | Status: AC
  Filled 2021-11-15: qty 4

## 2021-11-15 MED ORDER — POLYMYXIN B-TRIMETHOPRIM 10000-0.1 UNIT/ML-% OP SOLN: 1.0000 [drp] | Freq: Four times a day (QID) | OPHTHALMIC | 0 refills | Status: DC

## 2021-11-15 MED ORDER — ONDANSETRON HCL 4 MG PO TABS: 4.0000 mg | ORAL_TABLET | Freq: Three times a day (TID) | ORAL | 0 refills | Status: DC | PRN

## 2021-11-15 NOTE — ED Triage Notes (Signed)
Left eye irritation with headache, dizziness, nausea, and light sensitivity. Onset of Saturday.   Patient does wear contacts but no known trauma to the eye.

## 2021-11-15 NOTE — Discharge Instructions (Signed)
Advise to wear sun glasses to keep the sun from irritating the left eye. Advised to use the Polytrim eyedrops 1 drop every 4-6 hours for the next 48 hours. Advised if symptoms fail to improve or worsen then may need to consult ophthalmology.

## 2021-11-15 NOTE — ED Provider Notes (Signed)
[facility]    CSN: [id number] Arrival date & time: 11/15/21  1324      History   Chief Complaint Chief Complaint  Patient presents with   Eye Pain   Headache   Dizziness   Photophobia    HPI Audrey Klein is a 31 y.o. female.   31 year old female presents with left eye pain.  Patient relates that she was wearing nonprescription colored contacts and yesterday she started having left eye irritation.  Patient indicates that she tried to take the contact of the left eye this morning multiple times but was unsuccessful, then eventually she did get the contact out of the left eye.  Patient relates after getting the contact of the left eye she started having left irritation, redness, and light sensitivity.  Patient relates that the left eye irritation has continued.  Patient is having mild clear drainage from the left eye.  Patient relates she also has a history of having migraine headaches, and intermittent vertigo.  Patient relates that since she has been having the left irritation she started with migraine headache and nausea, without vomiting.  She relates this feels like one of her normal migraines.  Patient also relates that since having the left eye irritation she has been having some intermittent dizziness off-balance sensation.  Patient relates that her vision is normal in both eyes.  No fever, chills, or vomiting.   Eye Pain Associated symptoms include headaches.  Headache Associated symptoms: dizziness and eye pain (left eye)   Dizziness Associated symptoms: headaches     History reviewed. No pertinent past medical history.  There are no problems to display for this patient.   History reviewed. No pertinent surgical history.  OB History   No obstetric history on file.      Home Medications    Prior to Admission medications   Medication Sig Start Date End Date Taking? Authorizing Provider  trimethoprim-polymyxin b (POLYTRIM) ophthalmic solution Place 1  drop into both eyes every 6 (six) hours. 11/15/21  Yes Ellsworth Lennox, PA-C  amoxicillin-clavulanate (AUGMENTIN) 875-125 MG tablet Take 1 tablet by mouth every 12 (twelve) hours. Patient not taking: No sig reported 06/16/20   Wallis Bamberg, PA-C  benzonatate (TESSALON) 100 MG capsule Take 1-2 capsules (100-200 mg total) by mouth 3 (three) times daily as needed for cough. Patient not taking: No sig reported 06/16/20   Wallis Bamberg, PA-C  cetirizine (ZYRTEC ALLERGY) 10 MG tablet Take 1 tablet (10 mg total) by mouth daily. Patient not taking: No sig reported 06/16/20   Wallis Bamberg, PA-C  erythromycin ophthalmic ointment Place a 1/2 inch ribbon of ointment into the lower eyelid. Patient not taking: No sig reported 08/07/19   Coralyn Mark, NP  ibuprofen (ADVIL) 600 MG tablet Take 1 tablet (600 mg total) by mouth every 6 (six) hours as needed. 09/03/20   Elpidio Anis, PA-C  methocarbamol (ROBAXIN) 500 MG tablet Take 1 tablet (500 mg total) by mouth 2 (two) times daily. 09/03/20   Elpidio Anis, PA-C  naproxen (NAPROSYN) 500 MG tablet Take 1 tablet (500 mg total) by mouth 2 (two) times daily between meals as needed for moderate pain. Patient not taking: Reported on 09/03/2020 07/22/20   Lorelee New, PA-C  ondansetron (ZOFRAN) 4 MG tablet Take 1 tablet (4 mg total) by mouth every 8 (eight) hours as needed for nausea or vomiting. 11/15/21   Ellsworth Lennox, PA-C  promethazine-dextromethorphan (PROMETHAZINE-DM) 6.25-15 MG/5ML syrup Take 5 mLs by mouth at bedtime as  needed for cough. Patient not taking: No sig reported 06/16/20   Wallis Bamberg, PA-C  pseudoephedrine (SUDAFED) 60 MG tablet Take 1 tablet (60 mg total) by mouth every 8 (eight) hours as needed for congestion. Patient not taking: No sig reported 06/16/20   Wallis Bamberg, PA-C    Family History Family History  Problem Relation Age of Onset   Cancer Mother    Healthy Father     Social History Social History   Tobacco Use   Smoking status: Never    Smokeless tobacco: Never  Vaping Use   Vaping Use: Never used  Substance Use Topics   Alcohol use: Yes   Drug use: Never     Allergies   Patient has no known allergies.   Review of Systems Review of Systems  Eyes:  Positive for pain (left eye).  Neurological:  Positive for dizziness and headaches.     Physical Exam Triage Vital Signs ED Triage Vitals  Enc Vitals Group     BP 11/15/21 1429 126/86     Pulse Rate 11/15/21 1429 78     Resp 11/15/21 1429 16     Temp 11/15/21 1429 97.6 F (36.4 C)     Temp Source 11/15/21 1429 Oral     SpO2 11/15/21 1429 97 %     Weight 11/15/21 1431 286 lb (129.7 kg)     Height 11/15/21 1431 5\' 8"  (1.727 m)     Head Circumference --      Peak Flow --      Pain Score 11/15/21 1431 10     Pain Loc --      Pain Edu? --      Excl. in GC? --    No data found.  Updated Vital Signs BP 126/86 (BP Location: Left Arm)   Pulse 78   Temp 97.6 F (36.4 C) (Oral)   Resp 16   Ht 5\' 8"  (1.727 m)   Wt 286 lb (129.7 kg)   LMP 11/09/2021 (Exact Date)   SpO2 97%   BMI 43.49 kg/m   Visual Acuity Right Eye Distance:   Left Eye Distance:   Bilateral Distance:    Right Eye Near:   Left Eye Near:    Bilateral Near:     Physical Exam Constitutional:      Appearance: She is well-developed.  HENT:     Right Ear: Tympanic membrane and ear canal normal.     Left Ear: Tympanic membrane and ear canal normal.     Mouth/Throat:     Mouth: Mucous membranes are moist.     Pharynx: Oropharynx is clear. No posterior oropharyngeal erythema.  Eyes:     General: Lids are normal.     Extraocular Movements: Extraocular movements intact.     Comments: Left eye: but with mild redness present, PERRLA Eye evaluation: After applying 2 drops of the tetracaine, 2 drops of the stain was applied.  Under fluorescent light, no abrasions are noted, no foreign bodies detected.  The eye was flushed with sterile water.  No complications.  Neurological:      Mental Status: She is alert.      UC Treatments / Results  Labs (all labs ordered are listed, but only abnormal results are displayed) Labs Reviewed - No data to display  EKG   Radiology No results found.  Procedures Procedures (including critical care time)  Medications Ordered in UC Medications - No data to display  Initial Impression / Assessment and  Plan / UC Course  I have reviewed the triage vital signs and the nursing notes.  Pertinent labs & imaging results that were available during my care of the patient were reviewed by me and considered in my medical decision making (see chart for details).    Plan: 1.  Patient advised to wear sunglasses to help protect the eyes from the light. 2.  Patient advised to use the Polytrim eyedrops 1 drop every 4-6 hours over the next 48 hours. 3.  Patient advised to take the Zofran 4 mg 1 every 6 hours to decrease the nausea from the headache. 4.  Patient advised to take Naprosyn or ibuprofen to help decrease the pain from the migraine headache. 5.  Patient advised to follow-up with PCP or consider consulting specialist if the eye fails to improve over the next 48 hours. Final Clinical Impressions(s) / UC Diagnoses   Final diagnoses:  Pain of left eye  Abrasion of left cornea, initial encounter  Nausea without vomiting  Migraine without aura and without status migrainosus, not intractable     Discharge Instructions      Advise to wear sun glasses to keep the sun from irritating the left eye. Advised to use the Polytrim eyedrops 1 drop every 4-6 hours for the next 48 hours. Advised if symptoms fail to improve or worsen then may need to consult ophthalmology.     ED Prescriptions     Medication Sig Dispense Auth. Provider   ondansetron (ZOFRAN) 4 MG tablet Take 1 tablet (4 mg total) by mouth every 8 (eight) hours as needed for nausea or vomiting. 10 tablet Ellsworth Lennox, PA-C   trimethoprim-polymyxin b (POLYTRIM)  ophthalmic solution Place 1 drop into both eyes every 6 (six) hours. 10 mL Ellsworth Lennox, PA-C      PDMP not reviewed this encounter.   Ellsworth Lennox, PA-C 11/15/21 1530

## 2022-02-17 IMAGING — CT CT ABD-PELV W/ CM
2 of 4 series · 17 of 46 positions shown, 19 images · IV contrast (omnipaque)
Comparison: None.

CLINICAL DATA: 30-year-old female with concern for acute
diverticulitis.

EXAM:
CT ABDOMEN AND PELVIS WITH CONTRAST
TECHNIQUE: Multidetector CT imaging of the abdomen and pelvis was performed
using the standard protocol following bolus administration of
intravenous contrast.
CONTRAST:  100mL OMNIPAQUE IOHEXOL 300 MG/ML  SOLN

[Series 2: axial st · axial · 0.87mm/px · z∈[-162,+258]mm · 14 of 96 slices shown, 16 images]
[im 6/96  soft-tissue]
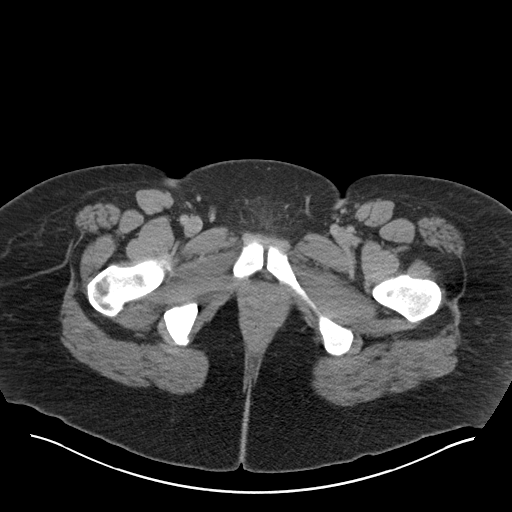
[im 6/96  bone]
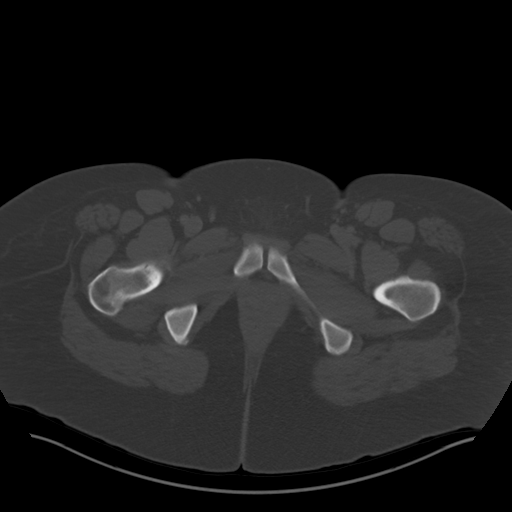
[im 11/96  soft-tissue]
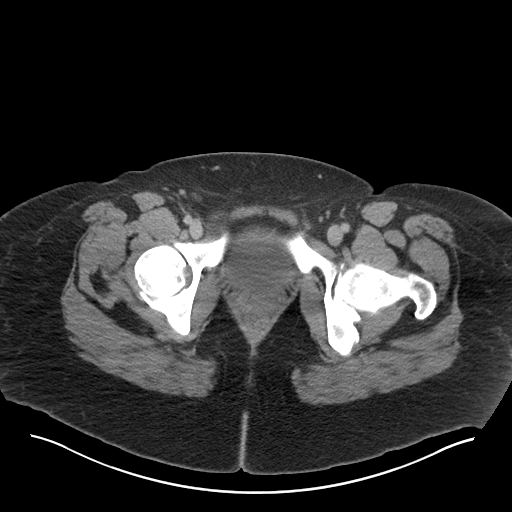
[im 22/96  soft-tissue]
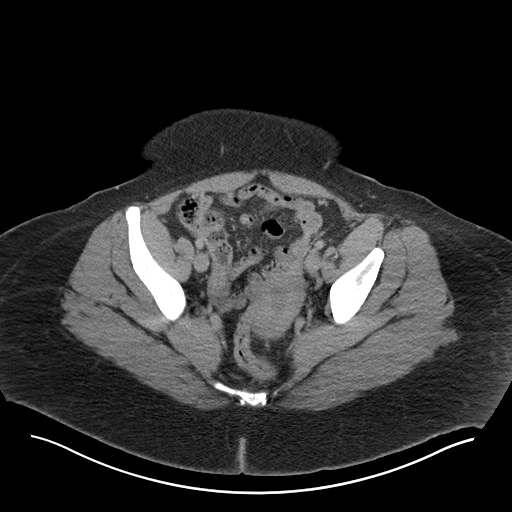
[im 27/96  soft-tissue]
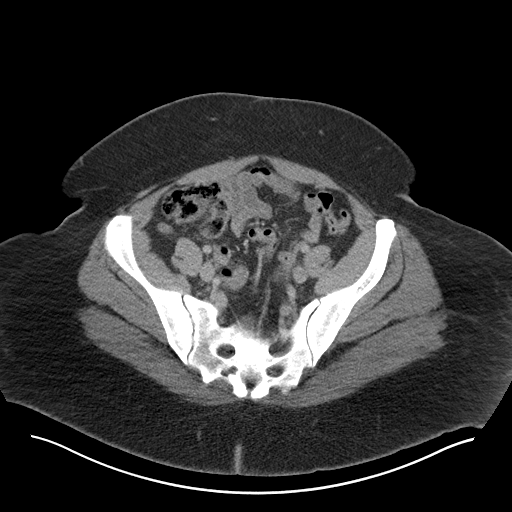
[im 32/96  soft-tissue]
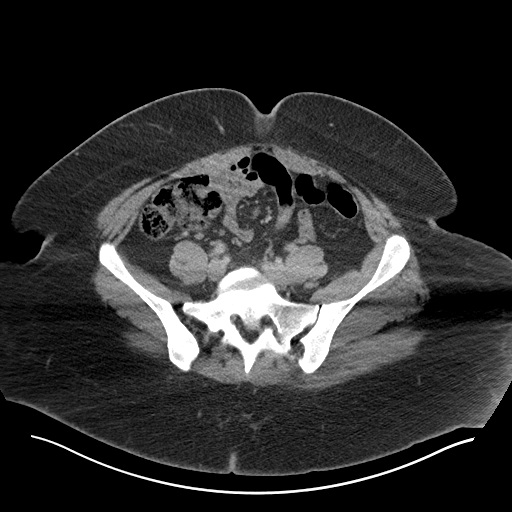
[im 37/96  soft-tissue]
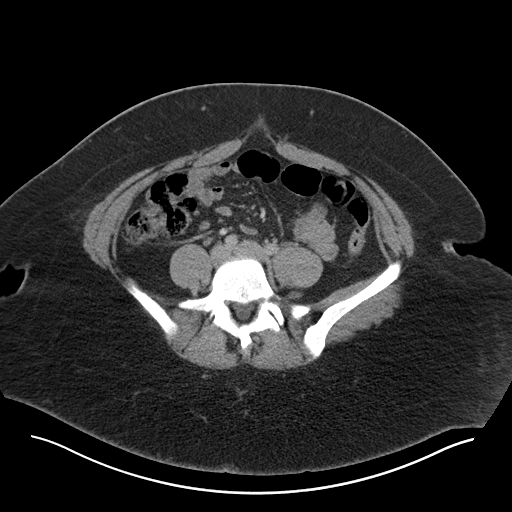
[im 43/96  soft-tissue]
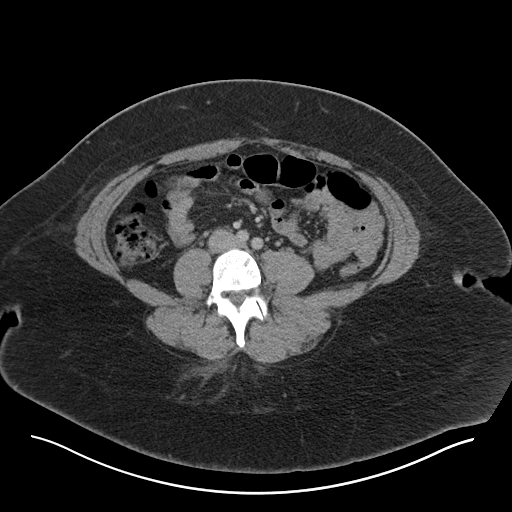
[im 53/96  soft-tissue]
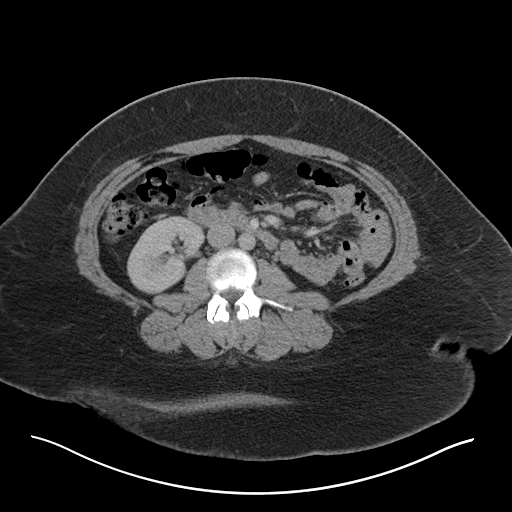
[im 59/96  soft-tissue]
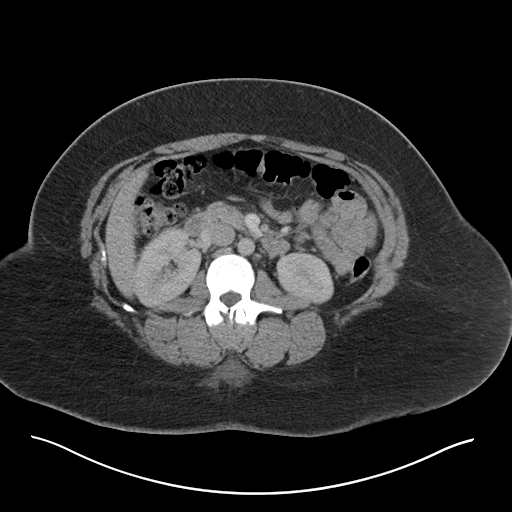
[im 59/96  bone]
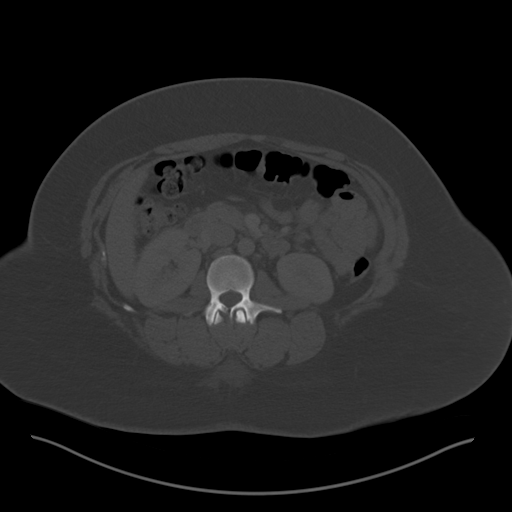
[im 64/96  soft-tissue]
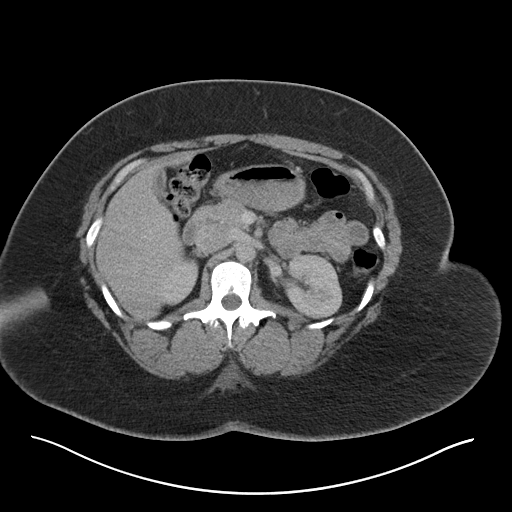
[im 69/96  soft-tissue]
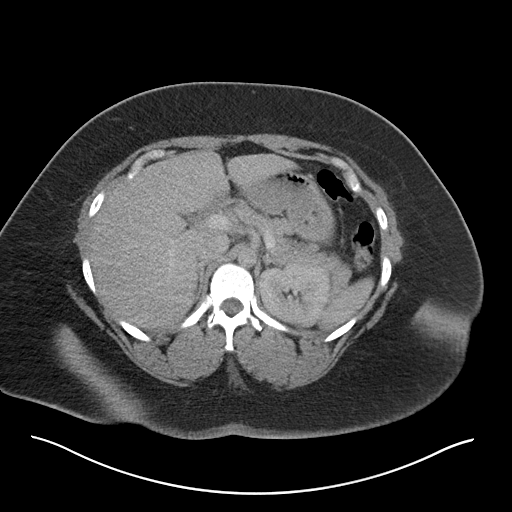
[im 74/96  soft-tissue]
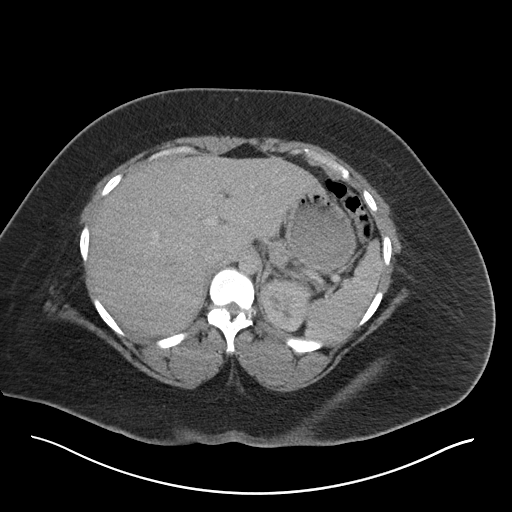
[im 85/96  soft-tissue]
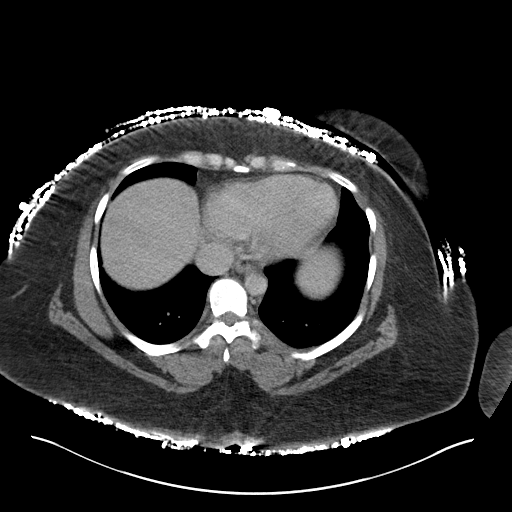
[im 90/96  soft-tissue]
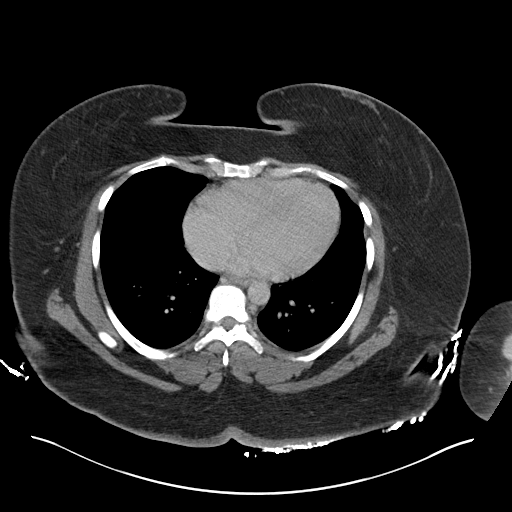

[Series 4: coronal st · coronal · 0.94mm/px · 3 of 164 slices shown]
[im 55/164  soft-tissue]
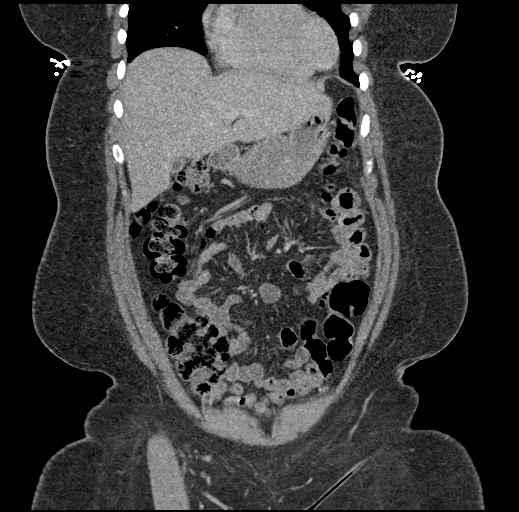
[im 73/164  soft-tissue]
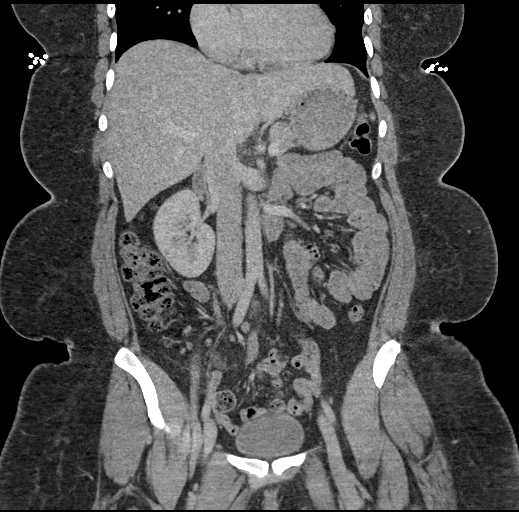
[im 91/164  soft-tissue]
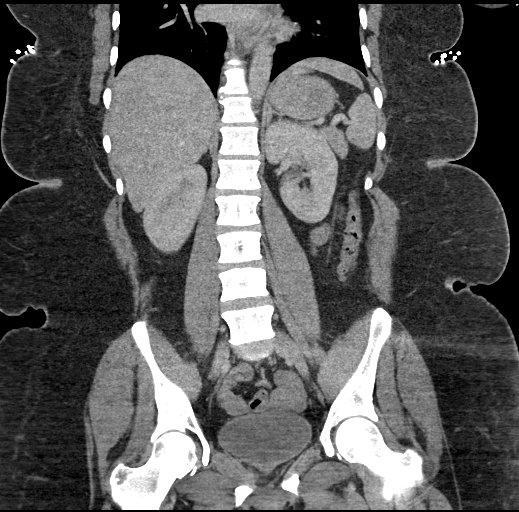

[17 of 46 positions shown; findings below may reference images not displayed]

FINDINGS: Lower chest: The visualized lung bases are clear.

No intra-abdominal free air or free fluid.

Hepatobiliary: Fatty liver. No intrahepatic biliary dilatation. No
calcified gallstone or pericholecystic fluid.

Pancreas: Unremarkable. No pancreatic ductal dilatation or
surrounding inflammatory changes.

Spleen: Normal in size without focal abnormality.

Adrenals/Urinary Tract: The adrenal glands unremarkable. The
kidneys, visualized ureters, and urinary bladder appear unremarkable

Stomach/Bowel: There is no bowel obstruction or active inflammation.
The appendix is normal.

Vascular/Lymphatic: The abdominal aorta and IVC unremarkable. No
portal venous gas. There is no adenopathy.

Reproductive: The uterus is anteverted and grossly unremarkable. No
adnexal masses.

Other: Small fat containing umbilical hernia.

Musculoskeletal: No acute or significant osseous findings.
IMPRESSION: 1. No acute intra-abdominal or pelvic pathology. No bowel
obstruction. Normal appendix.
2. Fatty liver.

## 2022-06-13 ENCOUNTER — Encounter (HOSPITAL_COMMUNITY): Payer: Self-pay

## 2022-06-13 ENCOUNTER — Ambulatory Visit (HOSPITAL_COMMUNITY)
Admission: EM | Admit: 2022-06-13 | Discharge: 2022-06-13 | Disposition: A | Discharge status: Home / Self Care | Payer: Self-pay | Attending: Nurse Practitioner | Admitting: Nurse Practitioner

## 2022-06-13 DIAGNOSIS — J0100 Acute maxillary sinusitis, unspecified: Secondary | ICD-10-CM

## 2022-06-13 DIAGNOSIS — R0981 Nasal congestion: Secondary | ICD-10-CM

## 2022-06-13 HISTORY — DX: Dizziness and giddiness: R42

## 2022-06-13 MED ORDER — FLUTICASONE PROPIONATE 50 MCG/ACT NA SUSP: 2.0000 | Freq: Every day | NASAL | 0 refills | Status: AC

## 2022-06-13 MED ORDER — AMOXICILLIN 875 MG PO TABS: 875.0000 mg | ORAL_TABLET | Freq: Two times a day (BID) | ORAL | 0 refills | Status: AC

## 2022-06-13 MED ORDER — PSEUDOEPHEDRINE HCL ER 120 MG PO TB12: 120.0000 mg | ORAL_TABLET | Freq: Two times a day (BID) | ORAL | 0 refills | Status: AC

## 2022-06-13 MED ORDER — METHYLPREDNISOLONE 4 MG PO TBPK: ORAL_TABLET | ORAL | 0 refills | Status: AC

## 2022-06-13 NOTE — ED Provider Notes (Signed)
[facility]    CSN: [id number] Arrival date & time: 06/13/22  1249      History   Chief Complaint Chief Complaint  Patient presents with   Nasal Congestion   Otalgia   Generalized Body Aches   Cough    HPI Audrey Klein is a 32 y.o. female.   Subjective:   Audrey Klein is a 32 y.o. female who presents for evaluation of possible sinus infection. Symptoms include left ear pressure/pain, achiness, congestion, facial pain, sneezing, mild nonproductive cough, mild nausea without vomiting, and sinus pressure with no fever, chills, night sweats or weight loss. Onset of symptoms was 4 days ago and gradually worsening since that time. She denies any rhinorrhea, PND, sore throat, vomiting or diarrhea. She is drinking plenty of fluids. She has tried Advil sinus/congestion, emerge-c, zinc and home remedies with some temporary relief in symptoms. Past history is significant for no history of pneumonia or bronchitis. Patient is a non-smoker.  The following portions of the patient's history were reviewed and updated as appropriate: allergies, current medications, past family history, past medical history, past social history, past surgical history, and problem list.       Past Medical History:  Diagnosis Date   Vertigo     There are no problems to display for this patient.   History reviewed. No pertinent surgical history.  OB History   No obstetric history on file.      Home Medications    Prior to Admission medications   Medication Sig Start Date End Date Taking? Authorizing Provider  amoxicillin (AMOXIL) 875 MG tablet Take 1 tablet (875 mg total) by mouth 2 (two) times daily for 7 days. 06/13/22 06/20/22 Yes Enrique Sack, FNP  fluticasone (FLONASE) 50 MCG/ACT nasal spray Place 2 sprays into both nostrils daily. 06/13/22  Yes Enrique Sack, FNP  methylPREDNISolone (MEDROL DOSEPAK) 4 MG TBPK tablet Take as directed 06/13/22  Yes Karlo Goeden, Aldona Bar, FNP   pseudoephedrine (SUDAFED 12 HOUR) 120 MG 12 hr tablet Take 1 tablet (120 mg total) by mouth 2 (two) times daily. 06/13/22  Yes Enrique Sack, FNP  ibuprofen (ADVIL) 600 MG tablet Take 1 tablet (600 mg total) by mouth every 6 (six) hours as needed. 09/03/20   Charlann Lange, PA-C    Family History Family History  Problem Relation Age of Onset   Cancer Mother    Healthy Father     Social History Social History   Tobacco Use   Smoking status: Never   Smokeless tobacco: Never  Vaping Use   Vaping Use: Never used  Substance Use Topics   Alcohol use: Not Currently   Drug use: Never     Allergies   Patient has no known allergies.   Review of Systems Review of Systems  Constitutional:  Positive for appetite change, chills, fatigue and fever. Negative for diaphoresis.  HENT:  Positive for congestion, ear pain, sinus pressure, sinus pain and sneezing. Negative for rhinorrhea and sore throat.   Respiratory:  Positive for cough. Negative for shortness of breath.   Gastrointestinal:  Positive for nausea. Negative for diarrhea and vomiting.  Neurological:  Positive for headaches.  All other systems reviewed and are negative.    Physical Exam Triage Vital Signs ED Triage Vitals  Enc Vitals Group     BP 06/13/22 1505 (!) 138/90     Pulse Rate 06/13/22 1505 73     Resp 06/13/22 1505 18     Temp 06/13/22 1505 97.9 F (36.6 C)  Temp Source 06/13/22 1505 Oral     SpO2 06/13/22 1505 100 %     Weight --      Height --      Head Circumference --      Peak Flow --      Pain Score 06/13/22 1510 9     Pain Loc --      Pain Edu? --      Excl. in [place]? --    No data found.  Updated Vital Signs BP (!) 138/90 (BP Location: Left Arm)   Pulse 73   Temp 97.9 F (36.6 C) (Oral)   Resp 18   LMP 06/01/2022   SpO2 100%   Visual Acuity Right Eye Distance:   Left Eye Distance:   Bilateral Distance:    Right Eye Near:   Left Eye Near:    Bilateral Near:     Physical  Exam Vitals reviewed.  Constitutional:      General: She is not in acute distress.    Appearance: Normal appearance. She is not ill-appearing, toxic-appearing or diaphoretic.  HENT:     Head: Normocephalic.     Right Ear: Tympanic membrane, ear canal and external ear normal.     Left Ear: Tympanic membrane, ear canal and external ear normal.     Nose: Congestion present.     Mouth/Throat:     Mouth: Mucous membranes are moist.  Eyes:     Conjunctiva/sclera: Conjunctivae normal.  Cardiovascular:     Rate and Rhythm: Normal rate and regular rhythm.  Pulmonary:     Effort: Pulmonary effort is normal.     Breath sounds: Normal breath sounds.  Musculoskeletal:        General: Normal range of motion.     Cervical back: Normal range of motion and neck supple. Tenderness present.  Lymphadenopathy:     Cervical: Cervical adenopathy present.  Skin:    General: Skin is warm and dry.  Neurological:     General: No focal deficit present.     Mental Status: She is alert and oriented to person, place, and time.      UC Treatments / Results  Labs (all labs ordered are listed, but only abnormal results are displayed) Labs Reviewed - No data to display  EKG   Radiology No results found.  Procedures Procedures (including critical care time)  Medications Ordered in UC Medications - No data to display  Initial Impression / Assessment and Plan / UC Course  I have reviewed the triage vital signs and the nursing notes.  Pertinent labs & imaging results that were available during my care of the patient were reviewed by me and considered in my medical decision making (see chart for details).    32 year old female presenting with acute maxillary sinusitis.  Patient is afebrile.  Nontoxic.  Physical exam as above.  Plan:  1. Sudafed 2. Amoxicillin 3. Medrol dose pack  4. Flonase daily  5. Continue home remedies and supportive care  6. Drink plenty of fluids   Today's evaluation  has revealed no signs of a dangerous process. Discussed diagnosis with patient and/or guardian. Patient and/or guardian aware of their diagnosis, possible red flag symptoms to watch out for and need for close follow up. Patient and/or guardian understands verbal and written discharge instructions. Patient and/or guardian comfortable with plan and disposition.  Patient and/or guardian has a clear mental status at this time, good insight into illness (after discussion and teaching) and has clear  judgment to make decisions regarding their care  Documentation was completed with the aid of voice recognition software. Transcription may contain typographical errors.  Final Clinical Impressions(s) / UC Diagnoses   Final diagnoses:  Nasal congestion  Acute non-recurrent maxillary sinusitis     Discharge Instructions      Your symptoms are likely due to a sinus infection. Take medications as prescribed. Continue your home remedies as well. You may take tylenol or ibuprofen as needed for fevers/headache/body aches. Drink plenty of fluids. Change your toothbrush         ED Prescriptions     Medication Sig Dispense Auth. Provider   amoxicillin (AMOXIL) 875 MG tablet Take 1 tablet (875 mg total) by mouth 2 (two) times daily for 7 days. 14 tablet Suriya Kovarik, Hope, FNP   methylPREDNISolone (MEDROL DOSEPAK) 4 MG TBPK tablet Take as directed 21 tablet Elijiah Mickley, FNP   fluticasone (FLONASE) 50 MCG/ACT nasal spray Place 2 sprays into both nostrils daily. 16 g Enrique Sack, FNP   pseudoephedrine (SUDAFED 12 HOUR) 120 MG 12 hr tablet Take 1 tablet (120 mg total) by mouth 2 (two) times daily. 20 tablet Enrique Sack, FNP      PDMP not reviewed this encounter.   Enrique Sack, [place] 06/13/22 [phone]

## 2022-06-13 NOTE — ED Triage Notes (Addendum)
Patient c/o nasal congestion that is tan in color, a non productive cough, body aches, and left ear pain x 4 days.  Patient added that she had a fever 2 days ago.

## 2022-06-13 NOTE — Discharge Instructions (Addendum)
Your symptoms are likely due to a sinus infection. Take medications as prescribed. Continue your home remedies as well. You may take tylenol or ibuprofen as needed for fevers/headache/body aches. Drink plenty of fluids. Change your toothbrush

## 2022-07-15 ENCOUNTER — Encounter (HOSPITAL_COMMUNITY): Payer: Self-pay | Admitting: *Deleted

## 2022-07-15 ENCOUNTER — Ambulatory Visit (HOSPITAL_COMMUNITY)
Admission: EM | Admit: 2022-07-15 | Discharge: 2022-07-15 | Disposition: A | Discharge status: Home / Self Care | Payer: Self-pay | Attending: Internal Medicine | Admitting: Internal Medicine

## 2022-07-15 DIAGNOSIS — G5602 Carpal tunnel syndrome, left upper limb: Secondary | ICD-10-CM | POA: Diagnosis not present

## 2022-07-15 DIAGNOSIS — M25532 Pain in left wrist: Secondary | ICD-10-CM

## 2022-07-15 DIAGNOSIS — G8929 Other chronic pain: Secondary | ICD-10-CM

## 2022-07-15 DIAGNOSIS — L03011 Cellulitis of right finger: Secondary | ICD-10-CM

## 2022-07-15 MED ORDER — CEPHALEXIN 500 MG PO CAPS: 500.0000 mg | ORAL_CAPSULE | Freq: Three times a day (TID) | ORAL | 0 refills | Status: AC

## 2022-07-15 MED ORDER — IBUPROFEN 800 MG PO TABS
800.0000 mg | ORAL_TABLET | Freq: Once | ORAL | Status: AC
  Administered 2022-07-15: 800 mg via ORAL

## 2022-07-15 MED ORDER — IBUPROFEN 800 MG PO TABS
ORAL_TABLET | ORAL | Status: AC
  Filled 2022-07-15: qty 1

## 2022-07-15 MED ORDER — NAPROXEN 500 MG PO TABS: 500.0000 mg | ORAL_TABLET | Freq: Two times a day (BID) | ORAL | 0 refills | Status: AC

## 2022-07-15 MED ORDER — CEPHALEXIN 500 MG PO CAPS: 500.0000 mg | ORAL_CAPSULE | Freq: Two times a day (BID) | ORAL | 0 refills | Status: DC

## 2022-07-15 NOTE — ED Triage Notes (Signed)
Pt here for multiple complaints.   She states back in 02/2022 she fell and tore a ligament in her left hand. She did see a hand specialist but she didn't like him. She states that she wasn't sent anywhere else. She said the initial injury was workers comp. She said the wrist and hand still swell and she is in pain a lot. She is wearing the brace she has been given. She is taking no meds for the pain.    She also states that she got her nails done 3 weeks ago and she was cut on her right pinky with the drill. She is still having some pain in that finger and wants to get her nails done again but she is scared and wants to make sure its ok to get her nails done.

## 2022-07-15 NOTE — Discharge Instructions (Addendum)
Take naproxen 526m twice daily for the next 2-3 days, then as needed for pain and inflammation to the left wrist.  Continue wearing the wrist brace to provide support to the left wrist.  [facility]up [facility] for ongoing evaluation of your left wrist pain.   I believe that the repetitive movement and frequent heavy lifting at your job is contributing to your left wrist pain.  I also believe he may be developing carpal tunnel syndrome which is inflammation of the nerves of the left wrist.  Take Keflex antibiotic 3 times a day for the next 7 days to treat the infection to your nail.  I recommend that you avoid getting her nails done until the infection to the right pinky finger nail heals.  Continue soaking in Epsom salt soaks.   If you develop any new or worsening symptoms or do not improve in the next 2 to 3 days, please return.  If your symptoms are severe, please go to the emergency room.  Follow-up with your primary care provider for further evaluation and management of your symptoms as well as ongoing wellness visits.  I hope you feel better!

## 2022-07-15 NOTE — ED Provider Notes (Signed)
[facility]    CSN: [id number] Arrival date & time: 07/15/22  1758      History   Chief Complaint Chief Complaint  Patient presents with   Hand Injury   Nail Problem    HPI Audrey Klein is a 32 y.o. female.   Patient presents to urgent care for evaluation of left wrist pain that started 1 year ago after she fell resulting in torn ligament to her middle finger of the left hand. She went through physical therapy for the injury sustained to the fall 1 year ago. She was seen by a specialist, however has not followed up. She believes she has nerve problems related to the injury due to nerve pain all the time that is persistent. She wears a brace to the left wrist for pain daily. States her left hand "swells up" without known trigger sometimes. She is unable to put a lot of pressure to the left hand without pain to the left wrist. Patient works as a [employer] manager and believes this aggravates the pain. Pain "shoots up her left arm" intermittently. Not taking any medications because "she doesn't like taking medicines"  She would also like to be evaluated for cut to the right pinky fingernail that happened 3 weeks ago while she was at the nail salon. The nail salon tech was using a drill to the nailbed when they accidentally caused an abrasion/cut to the right pinky fingernail bed. She wants to get her nails done again, but isn't sure if she should. The swelling has gone down significnatly and her symptoms have improved since performing epsom salt soaks. She has not had any fever or chills and denies drainage from the area of concern.    Hand Injury   Past Medical History:  Diagnosis Date   Vertigo     There are no problems to display for this patient.   History reviewed. No pertinent surgical history.  OB History   No obstetric history on file.      Home Medications    Prior to Admission medications   Medication Sig Start Date End Date Taking? Authorizing Provider  naproxen  (NAPROSYN) 500 MG tablet Take 1 tablet (500 mg total) by mouth 2 (two) times daily. 07/15/22  Yes Talbot Grumbling, FNP  cephALEXin (KEFLEX) 500 MG capsule Take 1 capsule (500 mg total) by mouth 3 (three) times daily for 7 days. 07/15/22 07/22/22  Talbot Grumbling, FNP  fluticasone (FLONASE) 50 MCG/ACT nasal spray Place 2 sprays into both nostrils daily. 06/13/22   Enrique Sack, FNP  ibuprofen (ADVIL) 600 MG tablet Take 1 tablet (600 mg total) by mouth every 6 (six) hours as needed. 09/03/20   Charlann Lange, PA-C  methylPREDNISolone (MEDROL DOSEPAK) 4 MG TBPK tablet Take as directed 06/13/22   Enrique Sack, FNP  pseudoephedrine (SUDAFED 12 HOUR) 120 MG 12 hr tablet Take 1 tablet (120 mg total) by mouth 2 (two) times daily. 06/13/22   Enrique Sack, FNP    Family History Family History  Problem Relation Age of Onset   Cancer Mother    Healthy Father     Social History Social History   Tobacco Use   Smoking status: Never   Smokeless tobacco: Never  Vaping Use   Vaping Use: Never used  Substance Use Topics   Alcohol use: Not Currently   Drug use: Never     Allergies   Patient has no known allergies.   Review of Systems Review of Systems Per HPI  Physical Exam Triage Vital Signs ED Triage Vitals  Enc Vitals Group     BP 07/15/22 1855 134/80     Pulse Rate 07/15/22 1855 77     Resp 07/15/22 1855 18     Temp 07/15/22 1855 98.8 F (37.1 C)     Temp Source 07/15/22 1855 Oral     SpO2 07/15/22 1855 95 %     Weight --      Height --      Head Circumference --      Peak Flow --      Pain Score 07/15/22 1851 10     Pain Loc --      Pain Edu? --      Excl. in [place]? --    No data found.  Updated Vital Signs BP 134/80 (BP Location: Left Arm)   Pulse 77   Temp 98.8 F (37.1 C) (Oral)   Resp 18   LMP 06/30/2022 (Exact Date)   SpO2 95%   Visual Acuity Right Eye Distance:   Left Eye Distance:   Bilateral Distance:    Right Eye Near:   Left Eye  Near:    Bilateral Near:     Physical Exam Vitals and nursing note reviewed.  Constitutional:      Appearance: She is not ill-appearing or toxic-appearing.  HENT:     Head: Normocephalic and atraumatic.     Right Ear: Hearing and external ear normal.     Left Ear: Hearing and external ear normal.     Nose: Nose normal.     Mouth/Throat:     Lips: Pink.  Eyes:     General: Lids are normal. Vision grossly intact. Gaze aligned appropriately.     Extraocular Movements: Extraocular movements intact.     Conjunctiva/sclera: Conjunctivae normal.  Cardiovascular:     Rate and Rhythm: Normal rate and regular rhythm.     Heart sounds: Normal heart sounds, S1 normal and S2 normal.  Pulmonary:     Effort: Pulmonary effort is normal. No respiratory distress.     Breath sounds: Normal breath sounds and air entry.  Musculoskeletal:     Right forearm: Normal.     Left forearm: Tenderness present.     Right wrist: Normal.     Left wrist: Tenderness present.     Right hand: Normal.     Left hand: Normal.     Cervical back: Neck supple.     Comments: 5/5 grip strength to bilateral upper extremities. Phalens and Tinnels test positive to the left upper extremity.  No swelling, deformity, decreased range of motion, or bony tenderness present to the left upper extremity.  Sensation intact to the left upper extremity distally.  Less than 3 capillary refill present.  Skin:    General: Skin is warm and dry.     Capillary Refill: Capillary refill takes less than 2 seconds.     Findings: No rash.     Comments: Nonfluctuant 3-5 mm erythematous paronychia present to the proximal nail fold of the right pinky finger nail without evidence of drainage.  Less than 3 capillary refill to affected digit.  Nailbed is intact.  Full range of motion to the right pinky finger.  Sensation intact distally.  Neurological:     General: No focal deficit present.     Mental Status: She is alert and oriented to person, place,  and time. Mental status is at baseline.     Cranial Nerves: No dysarthria or  facial asymmetry.  Psychiatric:        Mood and Affect: Mood normal.        Speech: Speech normal.        Behavior: Behavior normal.        Thought Content: Thought content normal.        Judgment: Judgment normal.      UC Treatments / Results  Labs (all labs ordered are listed, but only abnormal results are displayed) Labs Reviewed - No data to display  EKG   Radiology No results found.  Procedures Procedures (including critical care time)  Medications Ordered in UC Medications  ibuprofen (ADVIL) tablet 800 mg (800 mg Oral Given 07/15/22 1945)    Initial Impression / Assessment and Plan / UC Course  I have reviewed the triage vital signs and the nursing notes.  Pertinent labs & imaging results that were available during my care of the patient were reviewed by me and considered in my medical decision making (see chart for details).   1.  Chronic pain of left wrist, carpal tunnel syndrome of left wrist Patient's pain and discomfort are likely worsened by repetitive movement performed at her job contributing to likely carpal tunnel syndrome based on history and physical exam findings in clinic.  Positive Tinel's and Phalen's test.  She is to continue to wear the wrist brace to provide stability and compression of the median nerve to the left upper extremity.  Walking referral to orthopedics provided for follow-up of ongoing left wrist pain.  Deferred imaging based on stable musculoskeletal exam and chronic nature of patient's pain without recent new acute injury.  She may use naproxen 500 mg twice daily for the next 2 to 3 days, then use this as needed for pain and inflammation to the left wrist with food.  She is agreeable with this plan.  2.  Paronychia of right little finger No indication for incision and drainage of the paronychia to the right little finger.  Advised patient to avoid getting her  nails done until the paronychia is fully healed.  Keflex antibiotic 3 times daily for the next 7 days sent to pharmacy to treat infection.  Epsom salt soaks recommended.  She may get her nails done again once the paronychia has fully healed.   Discussed physical exam and available lab work findings in clinic with patient.  Counseled patient regarding appropriate use of medications and potential side effects for all medications recommended or prescribed today. Discussed red flag signs and symptoms of worsening condition,when to call the PCP office, return to urgent care, and when to seek higher level of care in the emergency department. Patient verbalizes understanding and agreement with plan. All questions answered. Patient discharged in stable condition.    Final Clinical Impressions(s) / UC Diagnoses   Final diagnoses:  Chronic pain of left wrist  Carpal tunnel syndrome of left wrist  Paronychia of right little finger     Discharge Instructions      Take naproxen 546m twice daily for the next 2-3 days, then as needed for pain and inflammation to the left wrist.  Continue wearing the wrist brace to provide support to the left wrist.  [facility] for ongoing evaluation of your left wrist pain.   I believe that the repetitive movement and frequent heavy lifting at your job is contributing to your left wrist pain.  I also believe he may be developing carpal tunnel syndrome which is inflammation of the nerves  of the left wrist.  Take Keflex antibiotic 3 times a day for the next 7 days to treat the infection to your nail.  I recommend that you avoid getting her nails done until the infection to the right pinky finger nail heals.  Continue soaking in Epsom salt soaks.   If you develop any new or worsening symptoms or do not improve in the next 2 to 3 days, please return.  If your symptoms are severe, please go to the emergency room.  Follow-up with your primary care  provider for further evaluation and management of your symptoms as well as ongoing wellness visits.  I hope you feel better!       ED Prescriptions     Medication Sig Dispense Auth. Provider   naproxen (NAPROSYN) 500 MG tablet Take 1 tablet (500 mg total) by mouth 2 (two) times daily. 30 tablet Joella Prince M, FNP   cephALEXin (KEFLEX) 500 MG capsule  (Status: Discontinued) Take 1 capsule (500 mg total) by mouth 2 (two) times daily for 7 days. 14 capsule Joella Prince M, FNP   cephALEXin (KEFLEX) 500 MG capsule Take 1 capsule (500 mg total) by mouth 3 (three) times daily for 7 days. 21 capsule Talbot Grumbling, FNP      PDMP not reviewed this encounter.   Talbot Grumbling, [place] 07/17/22 2107

## 2022-12-05 ENCOUNTER — Encounter (HOSPITAL_COMMUNITY): Payer: Self-pay | Admitting: Emergency Medicine

## 2022-12-05 ENCOUNTER — Other Ambulatory Visit: Payer: Self-pay

## 2022-12-05 ENCOUNTER — Emergency Department (HOSPITAL_COMMUNITY)
Admission: EM | Admit: 2022-12-05 | Discharge: 2022-12-05 | Disposition: A | Discharge status: Home / Self Care | Payer: Self-pay | Attending: Emergency Medicine | Admitting: Emergency Medicine

## 2022-12-05 DIAGNOSIS — R519 Headache, unspecified: Secondary | ICD-10-CM

## 2022-12-05 DIAGNOSIS — I10 Essential (primary) hypertension: Secondary | ICD-10-CM | POA: Insufficient documentation

## 2022-12-05 DIAGNOSIS — R600 Localized edema: Secondary | ICD-10-CM | POA: Insufficient documentation

## 2022-12-05 LAB — CBC WITH DIFFERENTIAL/PLATELET
Abs Immature Granulocytes: 0.03 10*3/uL (ref 0.00–0.07)
Basophils Absolute: 0 10*3/uL (ref 0.0–0.1)
Basophils Relative: 0 %
Eosinophils Absolute: 0.1 10*3/uL (ref 0.0–0.5)
Eosinophils Relative: 1 %
HCT: 38.3 % (ref 36.0–46.0)
Hemoglobin: 12.3 g/dL (ref 12.0–15.0)
Immature Granulocytes: 0 %
Lymphocytes Relative: 30 %
Lymphs Abs: 2.8 10*3/uL (ref 0.7–4.0)
MCH: 28.9 pg (ref 26.0–34.0)
MCHC: 32.1 g/dL (ref 30.0–36.0)
MCV: 89.9 fL (ref 80.0–100.0)
Monocytes Absolute: 0.6 10*3/uL (ref 0.1–1.0)
Monocytes Relative: 6 %
Neutro Abs: 5.9 10*3/uL (ref 1.7–7.7)
Neutrophils Relative %: 63 %
Platelets: 430 10*3/uL — ABNORMAL HIGH (ref 150–400)
RBC: 4.26 MIL/uL (ref 3.87–5.11)
RDW: 13.2 % (ref 11.5–15.5)
WBC: 9.4 10*3/uL (ref 4.0–10.5)
nRBC: 0 % (ref 0.0–0.2)

## 2022-12-05 LAB — COMPREHENSIVE METABOLIC PANEL
ALT: 28 U/L (ref 0–44)
AST: 22 U/L (ref 15–41)
Albumin: 3.6 g/dL (ref 3.5–5.0)
Alkaline Phosphatase: 59 U/L (ref 38–126)
Anion gap: 7 (ref 5–15)
BUN: 11 mg/dL (ref 6–20)
CO2: 24 mmol/L (ref 22–32)
Calcium: 8.5 mg/dL — ABNORMAL LOW (ref 8.9–10.3)
Chloride: 106 mmol/L (ref 98–111)
Creatinine, Ser: 0.76 mg/dL (ref 0.44–1.00)
GFR, Estimated: >60 mL/min (ref >60)
Glucose, Bld: 82 mg/dL (ref 70–99)
Potassium: 3.4 mmol/L — ABNORMAL LOW (ref 3.5–5.1)
Sodium: 137 mmol/L (ref 135–145)
Total Bilirubin: 0.4 mg/dL (ref 0.3–1.2)
Total Protein: 7.7 g/dL (ref 6.5–8.1)

## 2022-12-05 MED ORDER — METOCLOPRAMIDE HCL 10 MG PO TABS: 10.0000 mg | ORAL_TABLET | Freq: Four times a day (QID) | ORAL | 0 refills | Status: AC | PRN

## 2022-12-05 MED ORDER — HYDROCHLOROTHIAZIDE 12.5 MG PO TABS
12.5000 mg | ORAL_TABLET | Freq: Once | ORAL | Status: AC
  Administered 2022-12-05: 12.5 mg via ORAL
  Filled 2022-12-05: qty 1

## 2022-12-05 MED ORDER — MECLIZINE HCL 25 MG PO TABS: 25.0000 mg | ORAL_TABLET | Freq: Three times a day (TID) | ORAL | 0 refills | Status: AC | PRN

## 2022-12-05 MED ORDER — ACETAMINOPHEN 325 MG PO TABS
650.0000 mg | ORAL_TABLET | Freq: Once | ORAL | Status: AC
  Administered 2022-12-05: 650 mg via ORAL
  Filled 2022-12-05: qty 2

## 2022-12-05 MED ORDER — DIPHENHYDRAMINE HCL 25 MG PO TABS: 25.0000 mg | ORAL_TABLET | Freq: Four times a day (QID) | ORAL | 0 refills | Status: AC | PRN

## 2022-12-05 MED ORDER — HYDROCHLOROTHIAZIDE 12.5 MG PO CAPS: 12.5000 mg | ORAL_CAPSULE | Freq: Every day | ORAL | 0 refills | Status: AC

## 2022-12-05 MED ORDER — ONDANSETRON 8 MG PO TBDP
8.0000 mg | ORAL_TABLET | Freq: Once | ORAL | Status: AC
  Administered 2022-12-05: 8 mg via ORAL
  Filled 2022-12-05: qty 1

## 2022-12-05 NOTE — Discharge Instructions (Addendum)
1.  Get a blood pressure machine and start monitoring her blood pressure 2-3 times a day.  Take it when you are calm and relaxed and about the same time every day. 2.  You may start taking hydrochlorothiazide once a day as prescribed.  This is a blood pressure medication and will also help with your ankle swelling. 3.  You have been prescribed Antivert (meclizine) to take for vertigo if needed. 4.  Is very important you a family doctor to manage your blood pressure.  A resource guide has been included that has contact information for the [facility] family practice teaching service in the [facility] internal medicine teaching service.  You may call these offices for follow-up. 5.  Return to emergency department mediately if you have suddenly worsening or new concerning symptoms. 6.  You may take a combination of Reglan 10 mg with Benadryl 25 mg for headache.  Try this with extra strength Tylenol to see if it helps your headache.  If this is helpful, you may take this periodically for more severe headache.

## 2022-12-05 NOTE — ED Triage Notes (Signed)
Pt reports her BP has been up and down, 144/96 yesterday. Migraines since last week. Pt has been trying holistic measures for headache and elevated BP with no relief. Also noticed feet swelling since last week.

## 2022-12-05 NOTE — ED Provider Notes (Signed)
[place] EMERGENCY DEPARTMENT AT [facility] Provider Note   CSN: [id number] Arrival date & time: 12/05/22  1536     History  Chief Complaint  Patient presents with   Migraine   Foot Swelling    Audrey Klein is a 32 y.o. female.  HPI Patient reports that she has had problems with elevated blood pressure at home.  Maximal blood pressure measured at home was 160.  She has noted that she has not been feeling well and her blood pressures are elevated.  Patient does not believe she was previously diagnosed with specific diagnosis of hypertension, however she does report that for a while she had a physician that was prescribing her blood pressure medication for her.  She cannot remember the name.  She reports she has lived in this area for 4 years and prefers to do holistic and natural measures for her health care.  However, she reports she has been getting headaches now for a number of months and has noted swelling in the ankles.  Does stand at work.  She reports often swelling does improve with elevating.  Patient reports that she has had "vertigo" for a long time.  She denies ever taking specific medications for it.  She denies being told that was in conjunction with anything else.    Home Medications Prior to Admission medications   Medication Sig Start Date End Date Taking? Authorizing Provider  hydrochlorothiazide (MICROZIDE) 12.5 MG capsule Take 1 capsule (12.5 mg total) by mouth daily. 12/05/22  Yes Arby Barrette, MD  meclizine (ANTIVERT) 25 MG tablet Take 1 tablet (25 mg total) by mouth 3 (three) times daily as needed for dizziness. 12/05/22  Yes Arby Barrette, MD  fluticasone (FLONASE) 50 MCG/ACT nasal spray Place 2 sprays into both nostrils daily. 06/13/22   Lurline Idol, FNP  ibuprofen (ADVIL) 600 MG tablet Take 1 tablet (600 mg total) by mouth every 6 (six) hours as needed. 09/03/20   Elpidio Anis, PA-C  methylPREDNISolone (MEDROL DOSEPAK) 4 MG TBPK tablet Take as  directed 06/13/22   Lurline Idol, FNP  naproxen (NAPROSYN) 500 MG tablet Take 1 tablet (500 mg total) by mouth 2 (two) times daily. 07/15/22   Carlisle Beers, FNP  pseudoephedrine (SUDAFED 12 HOUR) 120 MG 12 hr tablet Take 1 tablet (120 mg total) by mouth 2 (two) times daily. 06/13/22   Lurline Idol, FNP      Allergies    Patient has no known allergies.    Review of Systems   Review of Systems  Physical Exam Updated Vital Signs BP (!) 150/101 (BP Location: Left Arm)   Pulse 66   Temp 98 F (36.7 C) (Oral)   Resp 16   Ht 5\' 8"  (1.727 m)   Wt 116.6 kg   LMP 11/28/2022 (Approximate)   SpO2 100%   BMI 39.08 kg/m  Physical Exam Constitutional:      Comments: Alert nontoxic clinically well in appearance.  HENT:     Head: Normocephalic and atraumatic.     Nose: Nose normal.     Mouth/Throat:     Mouth: Mucous membranes are moist.     Pharynx: Oropharynx is clear.  Eyes:     Extraocular Movements: Extraocular movements intact.     Conjunctiva/sclera: Conjunctivae normal.     Pupils: Pupils are equal, round, and reactive to light.  Cardiovascular:     Rate and Rhythm: Normal rate and regular rhythm.  Pulmonary:     Effort: Pulmonary effort  is normal.     Breath sounds: Normal breath sounds.  Abdominal:     General: There is no distension.     Palpations: Abdomen is soft.     Tenderness: There is no abdominal tenderness. There is no guarding.  Musculoskeletal:        General: Normal range of motion.     Cervical back: Neck supple.     Comments: Patient has about 1+ edema at both ankles.  The calves are soft and nontender.  No effusions of the knees or tenderness behind the knees.  Skin:    General: Skin is warm and dry.  Neurological:     General: No focal deficit present.     Mental Status: She is oriented to person, place, and time.     Motor: No weakness.     Coordination: Coordination normal.     Comments: Normal finger-nose exam bilaterally.  Speech  is clear and normal.     ED Results / Procedures / Treatments   Labs (all labs ordered are listed, but only abnormal results are displayed) Labs Reviewed  COMPREHENSIVE METABOLIC PANEL - Abnormal; Notable for the following components:      Result Value   Potassium 3.4 (*)    Calcium 8.5 (*)    All other components within normal limits  CBC WITH DIFFERENTIAL/PLATELET - Abnormal; Notable for the following components:   Platelets 430 (*)    All other components within normal limits  PREGNANCY, URINE    EKG None  Radiology No results found.  Procedures Procedures    Medications Ordered in ED Medications  hydrochlorothiazide (HYDRODIURIL) tablet 12.5 mg (has no administration in time range)  acetaminophen (TYLENOL) tablet 650 mg (has no administration in time range)  acetaminophen (TYLENOL) tablet 650 mg (650 mg Oral Given 12/05/22 1613)  ondansetron (ZOFRAN-ODT) disintegrating tablet 8 mg (8 mg Oral Given 12/05/22 1613)    ED Course/ Medical Decision Making/ A&P                             Medical Decision Making  Patient has had number of weeks of symptoms.  By history it sounds as though she has had diagnosis of at least borderline hypertension.  She does describe having been temporarily on medications.  Home blood pressures have been measuring up to 160s.  Patient has measured up to 150/100.  She does not have any signs of endorgan damage.  No encephalopathy, no chest pain or shortness of breath.  No neurologic symptoms to suggest CVA or intracerebral hemorrhage.  At this time feel she could restart on hydrochlorothiazide at 12.5 mg.  Patient is counseled on managing her blood pressure and measuring at home.  Counseled importance of close follow-up.  She voices understanding.  Resources have been included in discharge instructions.        Final Clinical Impression(s) / ED Diagnoses Final diagnoses:  Essential hypertension  Pedal edema  Acute nonintractable headache,  unspecified headache type    Rx / DC Orders ED Discharge Orders          Ordered    hydrochlorothiazide (MICROZIDE) 12.5 MG capsule  Daily        12/05/22 2323    meclizine (ANTIVERT) 25 MG tablet  3 times daily PRN        12/05/22 2323              Arby Barrette, MD 12/05/22 2335

## 2022-12-05 NOTE — ED Provider Triage Note (Signed)
Emergency Medicine Provider Triage Evaluation Note  Audrey Klein , a 32 y.o. female  was evaluated in triage.  Pt complains of elevated blood pressures, headache, generalized weakness, feet swelling, and lower back pain x1 week. She has tried holistic measures to help with her symptoms without relief.  Review of Systems  Positive: Bilateral lower extremity edema, generalized weakness, nausea Negative: Fever, vomiting, dysuria  Physical Exam  BP (!) 139/94 (BP Location: Left Arm)   Pulse 76   Temp 98.9 F (37.2 C) (Oral)   Resp 17   Ht 5\' 8"  (1.727 m)   Wt 116.6 kg   LMP 11/28/2022 (Approximate)   SpO2 100%   BMI 39.08 kg/m  Gen:   Awake, no distress   Resp:  Normal effort  MSK:   Bilateral lower extremity edema, Moves extremities without difficulty  Other:    Medical Decision Making  Medically screening exam initiated at 4:02 PM.  Appropriate orders placed.  Madeleine Lahner was informed that the remainder of the evaluation will be completed by another provider, this initial triage assessment does not replace that evaluation, and the importance of remaining in the ED until their evaluation is complete.    Maxwell Marion, PA-C 12/05/22 [phone]

## 2023-01-01 ENCOUNTER — Other Ambulatory Visit: Payer: Self-pay

## 2023-01-01 ENCOUNTER — Encounter (HOSPITAL_COMMUNITY): Payer: Self-pay

## 2023-01-01 ENCOUNTER — Emergency Department (HOSPITAL_COMMUNITY)
Admission: EM | Admit: 2023-01-01 | Discharge: 2023-01-02 | Disposition: A | Discharge status: Home / Self Care | Payer: Self-pay | Attending: Emergency Medicine | Admitting: Emergency Medicine

## 2023-01-01 DIAGNOSIS — U071 COVID-19: Secondary | ICD-10-CM

## 2023-01-01 NOTE — ED Triage Notes (Signed)
Pt. Arrives POV c/o flu-like symptoms. Pt. States that she has been around a friend who has been sick. She has body aches, a cough, and congestion x1 week.

## 2023-01-02 ENCOUNTER — Emergency Department (HOSPITAL_COMMUNITY): Payer: Self-pay

## 2023-01-02 MED ORDER — BENZONATATE 100 MG PO CAPS: 100.0000 mg | ORAL_CAPSULE | Freq: Three times a day (TID) | ORAL | 0 refills | Status: AC

## 2023-01-02 MED ORDER — IPRATROPIUM BROMIDE 0.03 % NA SOLN: 2.0000 | Freq: Two times a day (BID) | NASAL | 0 refills | Status: AC

## 2023-01-02 NOTE — ED Provider Notes (Signed)
[facility]-EMERGENCY DEPT [facility] Emergency Department Provider Note MRN:  [id number]  Arrival date & time: 01/02/23     Chief Complaint   Cough   History of Present Illness   Audrey Klein is a 32 y.o. year-old female presents to the ED with chief complaint of cough, congestion, rhinorrhea, headache, and body aches for the past week.  She has been taking Theraflu and Mucinex without much relief.  She states that her chest hurts from coughing.  States that her nose is so stuffy she can't breathe through it.  Denies measured fever.  She states that she has been around sick friends.  History provided by patient.   Review of Systems  Pertinent positive and negative review of systems noted in HPI.    Physical Exam   Vitals:   01/02/23 0003  BP: (!) 136/92  Pulse: 92  Resp: 16  Temp: 98.9 F (37.2 C)  SpO2: 100%    CONSTITUTIONAL:  non toxic-appearing, NAD NEURO:  Alert and oriented x 3, CN 3-12 grossly intact EYES:  eyes equal and reactive ENT/NECK:  Supple, no stridor, nasal congestion CARDIO:  normal rate, regular rhythm, appears well-perfused  PULM:  No respiratory distress, CTAB GI/GU:  non-distended,  MSK/SPINE:  No gross deformities, no edema, moves all extremities  SKIN:  no rash, atraumatic   *Additional and/or pertinent findings included in MDM below  Diagnostic and Interventional Summary    EKG Interpretation Date/Time:    Ventricular Rate:    PR Interval:    QRS Duration:    QT Interval:    QTC Calculation:   R Axis:      Text Interpretation:         Labs Reviewed  RESP PANEL BY RT-PCR (RSV, FLU A&B, COVID)  RVPGX2 - Abnormal; Notable for the following components:      Result Value   SARS Coronavirus 2 by RT PCR POSITIVE (*)    All other components within normal limits    DG Chest Port 1 View    (Results Pending)    Medications - No data to display   Procedures  /  Critical Care Procedures  ED Course and Medical Decision Making  I  have reviewed the triage vital signs, the nursing notes, and pertinent available records from the EMR.  Social Determinants Affecting Complexity of Care: Patient has no clinically significant social determinants affecting this chief complaint..   ED Course:    Medical Decision Making Patient here with symptoms consistent with COVID-19.  Will check COVID test.  She does not appear toxic.  She is afebrile.  COVID test positive.  She has been sick about a week now.  Doubt the utility of an antiviral, but I did offer this.  Patient declined.  Will treat with Tessalon Perles and Atrovent nasal spray.  Recommend continuing supportive care at home.  Patient understands agrees the plan.  She is stable and ready for discharge.  Amount and/or Complexity of Data Reviewed Radiology: ordered.  Risk Prescription drug management.         Consultants: No consultations were needed in caring for this patient.   Treatment and Plan: Emergency department workup does not suggest an emergent condition requiring admission or immediate intervention beyond  what has been performed at this time. The patient is safe for discharge and has  been instructed to return immediately for worsening symptoms, change in  symptoms or any other concerns    Final Clinical Impressions(s) / ED Diagnoses  ICD-10-CM   1. COVID-19  U07.1       ED Discharge Orders          Ordered    benzonatate (TESSALON) 100 MG capsule  Every 8 hours        01/02/23 0047    ipratropium (ATROVENT) 0.03 % nasal spray  Every 12 hours        01/02/23 0047              Discharge Instructions Discussed with and Provided to Patient:   Discharge Instructions   None      Roxy Horseman, PA-C 01/02/23 0049    Gloris Manchester, MD 01/04/23 [phone]

## 2023-05-15 ENCOUNTER — Other Ambulatory Visit: Payer: Self-pay

## 2023-05-15 ENCOUNTER — Encounter (HOSPITAL_COMMUNITY): Payer: Self-pay | Admitting: Emergency Medicine

## 2023-05-15 ENCOUNTER — Ambulatory Visit (HOSPITAL_COMMUNITY)
Admission: EM | Admit: 2023-05-15 | Discharge: 2023-05-15 | Disposition: A | Discharge status: Home / Self Care | Payer: No Typology Code available for payment source | Attending: Emergency Medicine | Admitting: Emergency Medicine

## 2023-05-15 DIAGNOSIS — R519 Headache, unspecified: Secondary | ICD-10-CM

## 2023-05-15 DIAGNOSIS — J040 Acute laryngitis: Secondary | ICD-10-CM | POA: Diagnosis not present

## 2023-05-15 MED ORDER — METOCLOPRAMIDE HCL 5 MG/ML IJ SOLN: 5.0000 mg | Freq: Once | INTRAMUSCULAR | Status: DC

## 2023-05-15 MED ORDER — LIDOCAINE VISCOUS HCL 2 % MT SOLN
15.0000 mL | Freq: Once | OROMUCOSAL | Status: AC
  Administered 2023-05-15: 15 mL via OROMUCOSAL

## 2023-05-15 MED ORDER — DEXAMETHASONE SODIUM PHOSPHATE 10 MG/ML IJ SOLN
10.0000 mg | Freq: Once | INTRAMUSCULAR | Status: DC
  Administered 2023-05-15: 10 mg via INTRAMUSCULAR

## 2023-05-15 MED ORDER — LIDOCAINE VISCOUS HCL 2 % MT SOLN: 15.0000 mL | OROMUCOSAL | 0 refills | Status: AC | PRN

## 2023-05-15 MED ORDER — KETOROLAC TROMETHAMINE 30 MG/ML IJ SOLN: 30.0000 mg | Freq: Once | INTRAMUSCULAR | Status: DC

## 2023-05-15 MED ORDER — METOCLOPRAMIDE HCL 5 MG/ML IJ SOLN
INTRAMUSCULAR | Status: AC
  Filled 2023-05-15: qty 2

## 2023-05-15 MED ORDER — LIDOCAINE VISCOUS HCL 2 % MT SOLN
OROMUCOSAL | Status: AC
  Filled 2023-05-15: qty 15

## 2023-05-15 MED ORDER — KETOROLAC TROMETHAMINE 30 MG/ML IJ SOLN
INTRAMUSCULAR | Status: AC
  Filled 2023-05-15: qty 1

## 2023-05-15 MED ORDER — DEXAMETHASONE SODIUM PHOSPHATE 10 MG/ML IJ SOLN
INTRAMUSCULAR | Status: AC
  Filled 2023-05-15: qty 1
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
## 2023-05-15 NOTE — ED Notes (Addendum)
[place] Garrison, NP aware of patient's refusal of IM injections

## 2023-05-15 NOTE — Discharge Instructions (Signed)
Take the next few days off of work to help your throat heal from the smoke exposure.  Warm saline gargles, tea with honey and sleeping with a humidifier may help as well.  Rest your voice.  Ensure you are drinking plenty of fluids and getting plenty of rest.  You may have a viral illness.  This should improve over the next 5 to 7 days.  You can alternate between 500 mg of extra strength Tylenol and 800 mg of ibuprofen for any pain or discomfort.  Return to clinic or follow-up with a primary care provider for any new concerning symptoms.
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
## 2023-06-08 ENCOUNTER — Emergency Department (HOSPITAL_COMMUNITY): Payer: No Typology Code available for payment source

## 2023-06-08 ENCOUNTER — Encounter (HOSPITAL_COMMUNITY): Payer: Self-pay

## 2023-06-08 ENCOUNTER — Emergency Department (HOSPITAL_COMMUNITY)
Admission: EM | Admit: 2023-06-08 | Discharge: 2023-06-08 | Disposition: A | Discharge status: Home / Self Care | Payer: No Typology Code available for payment source | Attending: Emergency Medicine | Admitting: Emergency Medicine

## 2023-06-08 ENCOUNTER — Other Ambulatory Visit: Payer: Self-pay

## 2023-06-08 DIAGNOSIS — R0789 Other chest pain: Secondary | ICD-10-CM | POA: Diagnosis present

## 2023-06-08 MED ORDER — LIDOCAINE 5 % EX PTCH
1.0000 | MEDICATED_PATCH | CUTANEOUS | Status: DC
  Administered 2023-06-08: 1 via TRANSDERMAL
  Filled 2023-06-08: qty 1

## 2023-06-08 MED ORDER — IBUPROFEN 200 MG PO TABS
600.0000 mg | ORAL_TABLET | Freq: Once | ORAL | Status: AC
  Administered 2023-06-08: 600 mg via ORAL
  Filled 2023-06-08: qty 3
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
## 2023-06-08 NOTE — Discharge Instructions (Addendum)
 Thank you for letting us  evaluate you today.  Your chest x-ray is negative for fractures or any other acute abnormalities.  We have given you ibuprofen  and lidocaine  patch for your pain.  Please follow-up with your PCP if symptoms persist.  Please return to emergency department if you experience chest pain, shortness of breath

## 2023-07-14 ENCOUNTER — Other Ambulatory Visit: Payer: Self-pay | Admitting: Family Medicine

## 2023-07-14 DIAGNOSIS — N644 Mastodynia: Secondary | ICD-10-CM

## 2023-08-08 ENCOUNTER — Other Ambulatory Visit: Payer: Self-pay | Admitting: Family Medicine

## 2023-08-08 ENCOUNTER — Ambulatory Visit
Admission: RE | Admit: 2023-08-08 | Discharge: 2023-08-08 | Disposition: A | Discharge status: Home / Self Care | Source: Ambulatory Visit | Attending: Family Medicine | Admitting: Family Medicine

## 2023-08-08 DIAGNOSIS — M79673 Pain in unspecified foot: Secondary | ICD-10-CM

## 2023-08-16 ENCOUNTER — Other Ambulatory Visit

## 2023-08-16 ENCOUNTER — Encounter

## 2023-09-07 ENCOUNTER — Other Ambulatory Visit: Payer: Self-pay | Admitting: Nurse Practitioner

## 2023-09-07 ENCOUNTER — Ambulatory Visit: Payer: Self-pay

## 2023-09-07 ENCOUNTER — Other Ambulatory Visit

## 2023-09-07 DIAGNOSIS — M25571 Pain in right ankle and joints of right foot: Secondary | ICD-10-CM

## 2023-09-07 DIAGNOSIS — M79671 Pain in right foot: Secondary | ICD-10-CM

## 2023-09-15 ENCOUNTER — Ambulatory Visit: Payer: Self-pay

## 2023-09-15 ENCOUNTER — Other Ambulatory Visit: Payer: Self-pay | Admitting: Nurse Practitioner

## 2023-09-15 DIAGNOSIS — M25571 Pain in right ankle and joints of right foot: Secondary | ICD-10-CM

## 2024-05-08 ENCOUNTER — Ambulatory Visit: Admitting: Dermatology
